# Patient Record
Sex: Female | Born: 1985 | Race: White | Hispanic: No | Marital: Married | Smoking: Never smoker
Health system: Southern US, Community
[De-identification: ages and names within clinical notes are randomized; demographics above are authoritative.]

## PROBLEM LIST (undated history)

## (undated) DIAGNOSIS — F32A Depression, unspecified: Secondary | ICD-10-CM

## (undated) DIAGNOSIS — J020 Streptococcal pharyngitis: Secondary | ICD-10-CM

## (undated) DIAGNOSIS — D696 Thrombocytopenia, unspecified: Secondary | ICD-10-CM

## (undated) DIAGNOSIS — F419 Anxiety disorder, unspecified: Secondary | ICD-10-CM

## (undated) DIAGNOSIS — D649 Anemia, unspecified: Secondary | ICD-10-CM

## (undated) DIAGNOSIS — F329 Major depressive disorder, single episode, unspecified: Secondary | ICD-10-CM

## (undated) HISTORY — PX: ADENOIDECTOMY: SUR15

## (undated) HISTORY — PX: TONSILLECTOMY: SUR1361

---
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
## 2004-03-08 ENCOUNTER — Other Ambulatory Visit: Admission: RE | Admit: 2004-03-08 | Discharge: 2004-03-08 | Payer: Self-pay | Admitting: Obstetrics and Gynecology

## 2004-09-08 ENCOUNTER — Other Ambulatory Visit: Admission: RE | Admit: 2004-09-08 | Discharge: 2004-09-08 | Payer: Self-pay | Admitting: Gynecology

## 2004-12-18 ENCOUNTER — Emergency Department (HOSPITAL_COMMUNITY): Admission: EM | Admit: 2004-12-18 | Discharge: 2004-12-18 | Payer: Self-pay | Admitting: Emergency Medicine

## 2007-11-24 ENCOUNTER — Encounter: Payer: Self-pay | Admitting: Gastroenterology

## 2007-12-10 ENCOUNTER — Other Ambulatory Visit: Admission: RE | Admit: 2007-12-10 | Discharge: 2007-12-10 | Payer: Self-pay | Admitting: Gynecology

## 2008-03-10 ENCOUNTER — Ambulatory Visit: Payer: Self-pay | Admitting: Gastroenterology

## 2008-03-10 DIAGNOSIS — F411 Generalized anxiety disorder: Secondary | ICD-10-CM | POA: Insufficient documentation

## 2008-03-10 DIAGNOSIS — R1084 Generalized abdominal pain: Secondary | ICD-10-CM

## 2008-03-10 DIAGNOSIS — K5900 Constipation, unspecified: Secondary | ICD-10-CM | POA: Insufficient documentation

## 2008-03-10 LAB — CONVERTED CEMR LAB
Tissue Transglutaminase Ab, IgA: 0.1 units (ref <7)
hCG, Beta Chain, Quant, S: <0.5 milliintl units/mL

## 2008-03-11 ENCOUNTER — Telehealth: Payer: Self-pay | Admitting: Gastroenterology

## 2008-03-11 DIAGNOSIS — E538 Deficiency of other specified B group vitamins: Secondary | ICD-10-CM | POA: Insufficient documentation

## 2008-03-12 ENCOUNTER — Ambulatory Visit: Payer: Self-pay | Admitting: Gastroenterology

## 2008-03-18 ENCOUNTER — Ambulatory Visit: Payer: Self-pay | Admitting: Gastroenterology

## 2008-03-25 ENCOUNTER — Ambulatory Visit: Payer: Self-pay | Admitting: Gastroenterology

## 2008-06-01 ENCOUNTER — Ambulatory Visit: Payer: Self-pay | Admitting: Gastroenterology

## 2008-06-10 ENCOUNTER — Other Ambulatory Visit: Admission: RE | Admit: 2008-06-10 | Discharge: 2008-06-10 | Payer: Self-pay | Admitting: Gynecology

## 2008-06-15 ENCOUNTER — Ambulatory Visit: Payer: Self-pay | Admitting: Gastroenterology

## 2008-06-15 ENCOUNTER — Encounter: Payer: Self-pay | Admitting: Gastroenterology

## 2008-06-16 LAB — CONVERTED CEMR LAB
ALT: 9 units/L (ref 0–35)
AST: 13 units/L (ref 0–37)
Albumin: 4 g/dL (ref 3.5–5.2)
Alkaline Phosphatase: 36 units/L — ABNORMAL LOW (ref 39–117)
BUN: 6 mg/dL (ref 6–23)
Basophils Absolute: 0 10*3/uL (ref 0.0–0.1)
Basophils Relative: 0.6 % (ref 0.0–1.0)
Bilirubin, Direct: 0.2 mg/dL (ref 0.0–0.3)
CO2: 32 meq/L (ref 19–32)
Calcium: 9.1 mg/dL (ref 8.4–10.5)
Chloride: 104 meq/L (ref 96–112)
Creatinine, Ser: 0.7 mg/dL (ref 0.4–1.2)
Eosinophils Absolute: 0.2 10*3/uL (ref 0.0–0.7)
Eosinophils Relative: 2.8 % (ref 0.0–5.0)
Ferritin: 14.8 ng/mL (ref 10.0–291.0)
Folate: 6.5 ng/mL
GFR calc Af Amer: 132 mL/min
GFR calc non Af Amer: 109 mL/min
Glucose, Bld: 69 mg/dL — ABNORMAL LOW (ref 70–99)
HCT: 40.5 % (ref 36.0–46.0)
Hemoglobin: 14 g/dL (ref 12.0–15.0)
Iron: 136 ug/dL (ref 42–145)
Lymphocytes Relative: 30.9 % (ref 12.0–46.0)
MCHC: 34.6 g/dL (ref 30.0–36.0)
MCV: 92.3 fL (ref 78.0–100.0)
Monocytes Absolute: 0.4 10*3/uL (ref 0.1–1.0)
Monocytes Relative: 6.9 % (ref 3.0–12.0)
Neutro Abs: 3.6 10*3/uL (ref 1.4–7.7)
Neutrophils Relative %: 58.8 % (ref 43.0–77.0)
Platelets: 165 10*3/uL (ref 150–400)
Potassium: 4.1 meq/L (ref 3.5–5.1)
RBC: 4.39 M/uL (ref 3.87–5.11)
RDW: 12.6 % (ref 11.5–14.6)
Saturation Ratios: 35.5 % (ref 20.0–50.0)
Sodium: 141 meq/L (ref 135–145)
TSH: 0.55 microintl units/mL (ref 0.35–5.50)
Total Bilirubin: 1.3 mg/dL — ABNORMAL HIGH (ref 0.3–1.2)
Total Protein: 6.6 g/dL (ref 6.0–8.3)
Transferrin: 273.3 mg/dL (ref >=212.0)
Vitamin B-12: 182 pg/mL — ABNORMAL LOW (ref 211–911)
WBC: 6.1 10*3/uL (ref 4.5–10.5)

## 2008-06-17 ENCOUNTER — Encounter: Payer: Self-pay | Admitting: Gastroenterology

## 2008-06-20 ENCOUNTER — Ambulatory Visit (HOSPITAL_COMMUNITY): Admission: RE | Admit: 2008-06-20 | Discharge: 2008-06-20 | Payer: Self-pay | Admitting: Gastroenterology

## 2008-06-22 ENCOUNTER — Telehealth: Payer: Self-pay | Admitting: Gastroenterology

## 2008-08-21 ENCOUNTER — Telehealth: Payer: Self-pay | Admitting: Gastroenterology

## 2008-10-05 ENCOUNTER — Telehealth: Payer: Self-pay | Admitting: Gastroenterology

## 2008-10-05 ENCOUNTER — Encounter: Payer: Self-pay | Admitting: Gastroenterology

## 2008-10-06 ENCOUNTER — Ambulatory Visit: Payer: Self-pay | Admitting: Gastroenterology

## 2008-10-06 DIAGNOSIS — K625 Hemorrhage of anus and rectum: Secondary | ICD-10-CM | POA: Insufficient documentation

## 2008-10-06 LAB — CONVERTED CEMR LAB
ALT: 10 units/L (ref 0–35)
AST: 17 units/L (ref 0–37)
Albumin: 3.9 g/dL (ref 3.5–5.2)
Alkaline Phosphatase: 27 units/L — ABNORMAL LOW (ref 39–117)
BUN: 9 mg/dL (ref 6–23)
Basophils Absolute: 0 10*3/uL (ref 0.0–0.1)
Basophils Relative: 0.8 % (ref 0.0–3.0)
Bilirubin, Direct: 0.1 mg/dL (ref 0.0–0.3)
CO2: 30 meq/L (ref 19–32)
Calcium: 9.1 mg/dL (ref 8.4–10.5)
Chloride: 103 meq/L (ref 96–112)
Creatinine, Ser: 0.8 mg/dL (ref 0.4–1.2)
Eosinophils Absolute: 0.1 10*3/uL (ref 0.0–0.7)
Eosinophils Relative: 2.5 % (ref 0.0–5.0)
GFR calc Af Amer: 113 mL/min
GFR calc non Af Amer: 94 mL/min
Glucose, Bld: 88 mg/dL (ref 70–99)
HCT: 35.7 % — ABNORMAL LOW (ref 36.0–46.0)
Hemoglobin: 12.4 g/dL (ref 12.0–15.0)
Lymphocytes Relative: 24.8 % (ref 12.0–46.0)
MCHC: 34.7 g/dL (ref 30.0–36.0)
MCV: 92.1 fL (ref 78.0–100.0)
Monocytes Absolute: 0.4 10*3/uL (ref 0.1–1.0)
Monocytes Relative: 7.2 % (ref 3.0–12.0)
Neutro Abs: 3.5 10*3/uL (ref 1.4–7.7)
Neutrophils Relative %: 64.7 % (ref 43.0–77.0)
Platelets: 128 10*3/uL — ABNORMAL LOW (ref 150–400)
Potassium: 4 meq/L (ref 3.5–5.1)
RBC: 3.88 M/uL (ref 3.87–5.11)
RDW: 12.1 % (ref 11.5–14.6)
Sed Rate: 9 mm/hr (ref 0–22)
Sodium: 138 meq/L (ref 135–145)
TSH: 1.31 microintl units/mL (ref 0.35–5.50)
Total Bilirubin: 1 mg/dL (ref 0.3–1.2)
Total Protein: 6.4 g/dL (ref 6.0–8.3)
WBC: 5.3 10*3/uL (ref 4.5–10.5)

## 2008-10-07 ENCOUNTER — Encounter: Payer: Self-pay | Admitting: Gastroenterology

## 2008-10-07 ENCOUNTER — Ambulatory Visit: Payer: Self-pay | Admitting: Gastroenterology

## 2008-10-12 ENCOUNTER — Encounter: Payer: Self-pay | Admitting: Gastroenterology

## 2008-10-20 ENCOUNTER — Telehealth: Payer: Self-pay | Admitting: Gastroenterology

## 2009-01-25 IMAGING — CR DG ABDOMEN 1V
2 series · 2 of 2 positions shown · non-contrast
Comparison: None.

CLINICAL DATA: 22-year-old female with history of constipation.
Sitz markers administered on 06/15/2008.

ABDOMEN - 1 VIEW

[t abdomen supine * (1 of 2)]
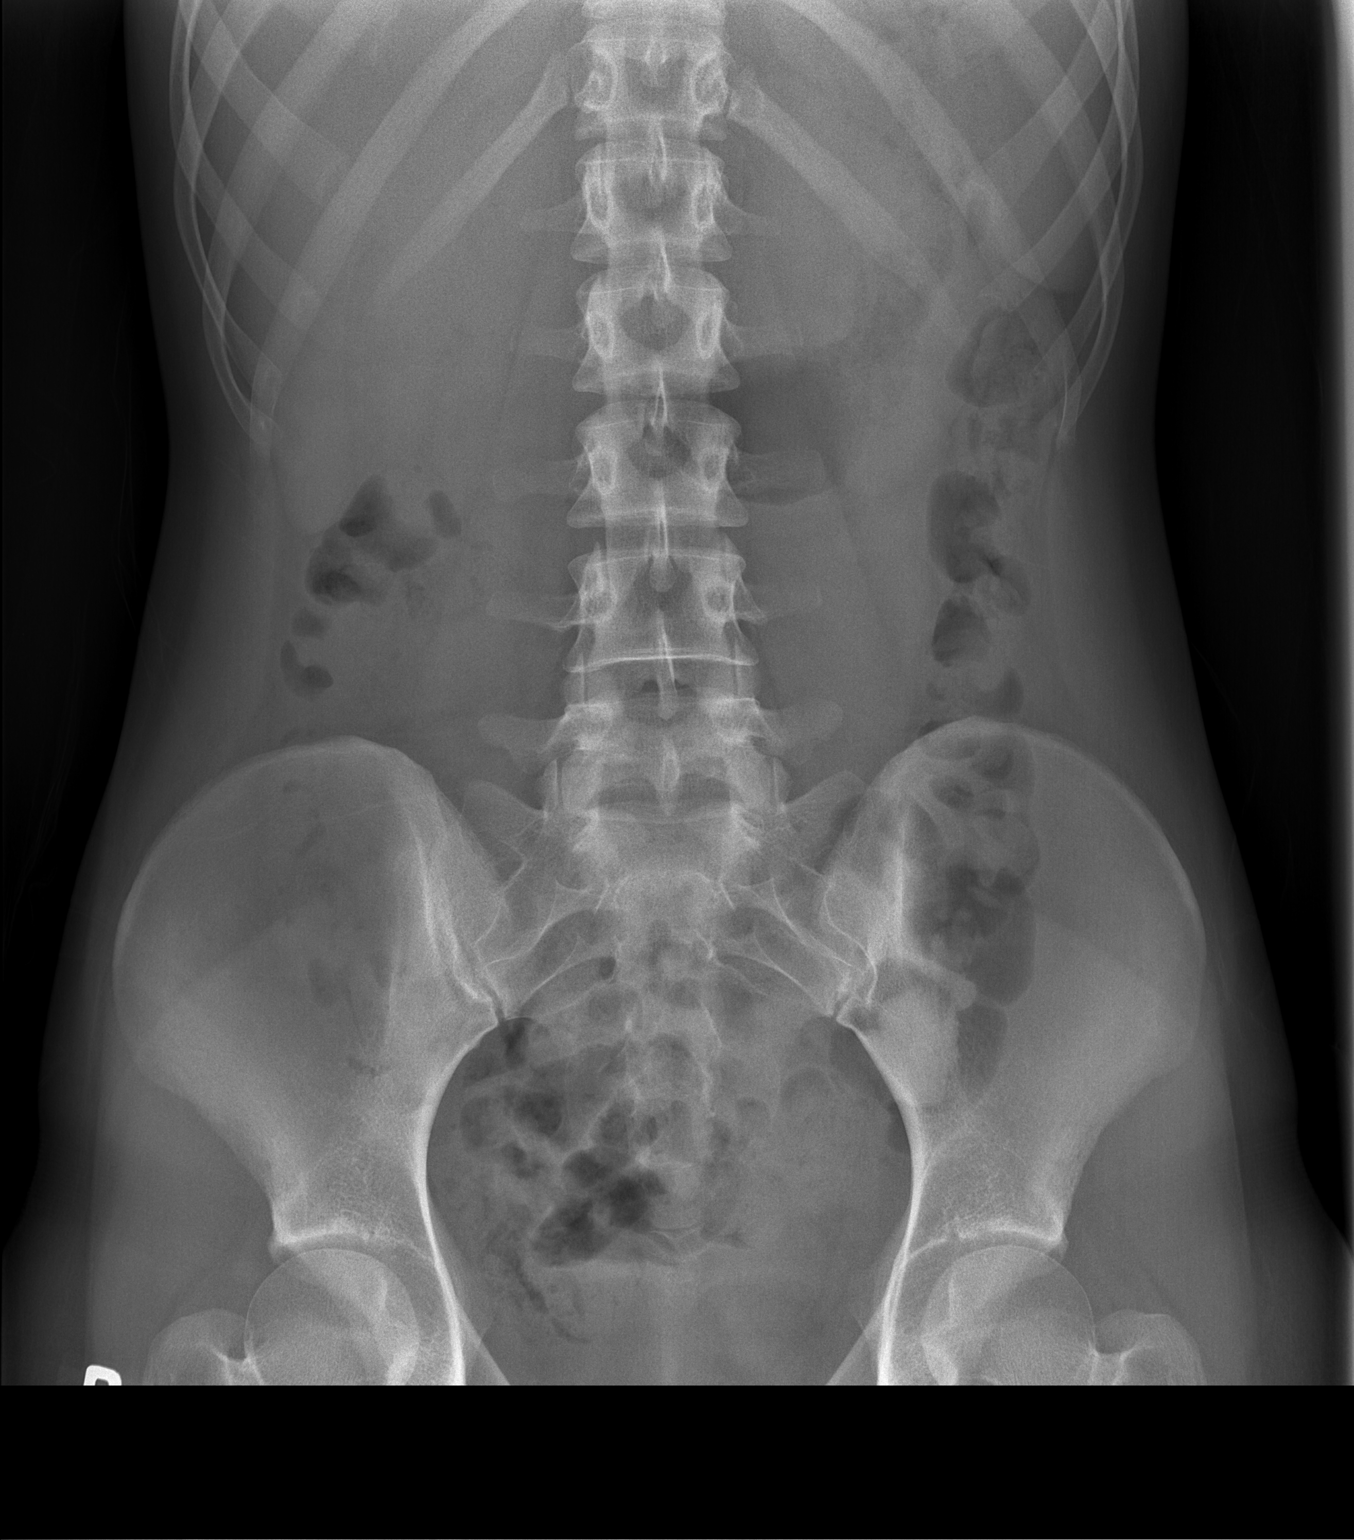

[t abdomen supine * (2 of 2)]
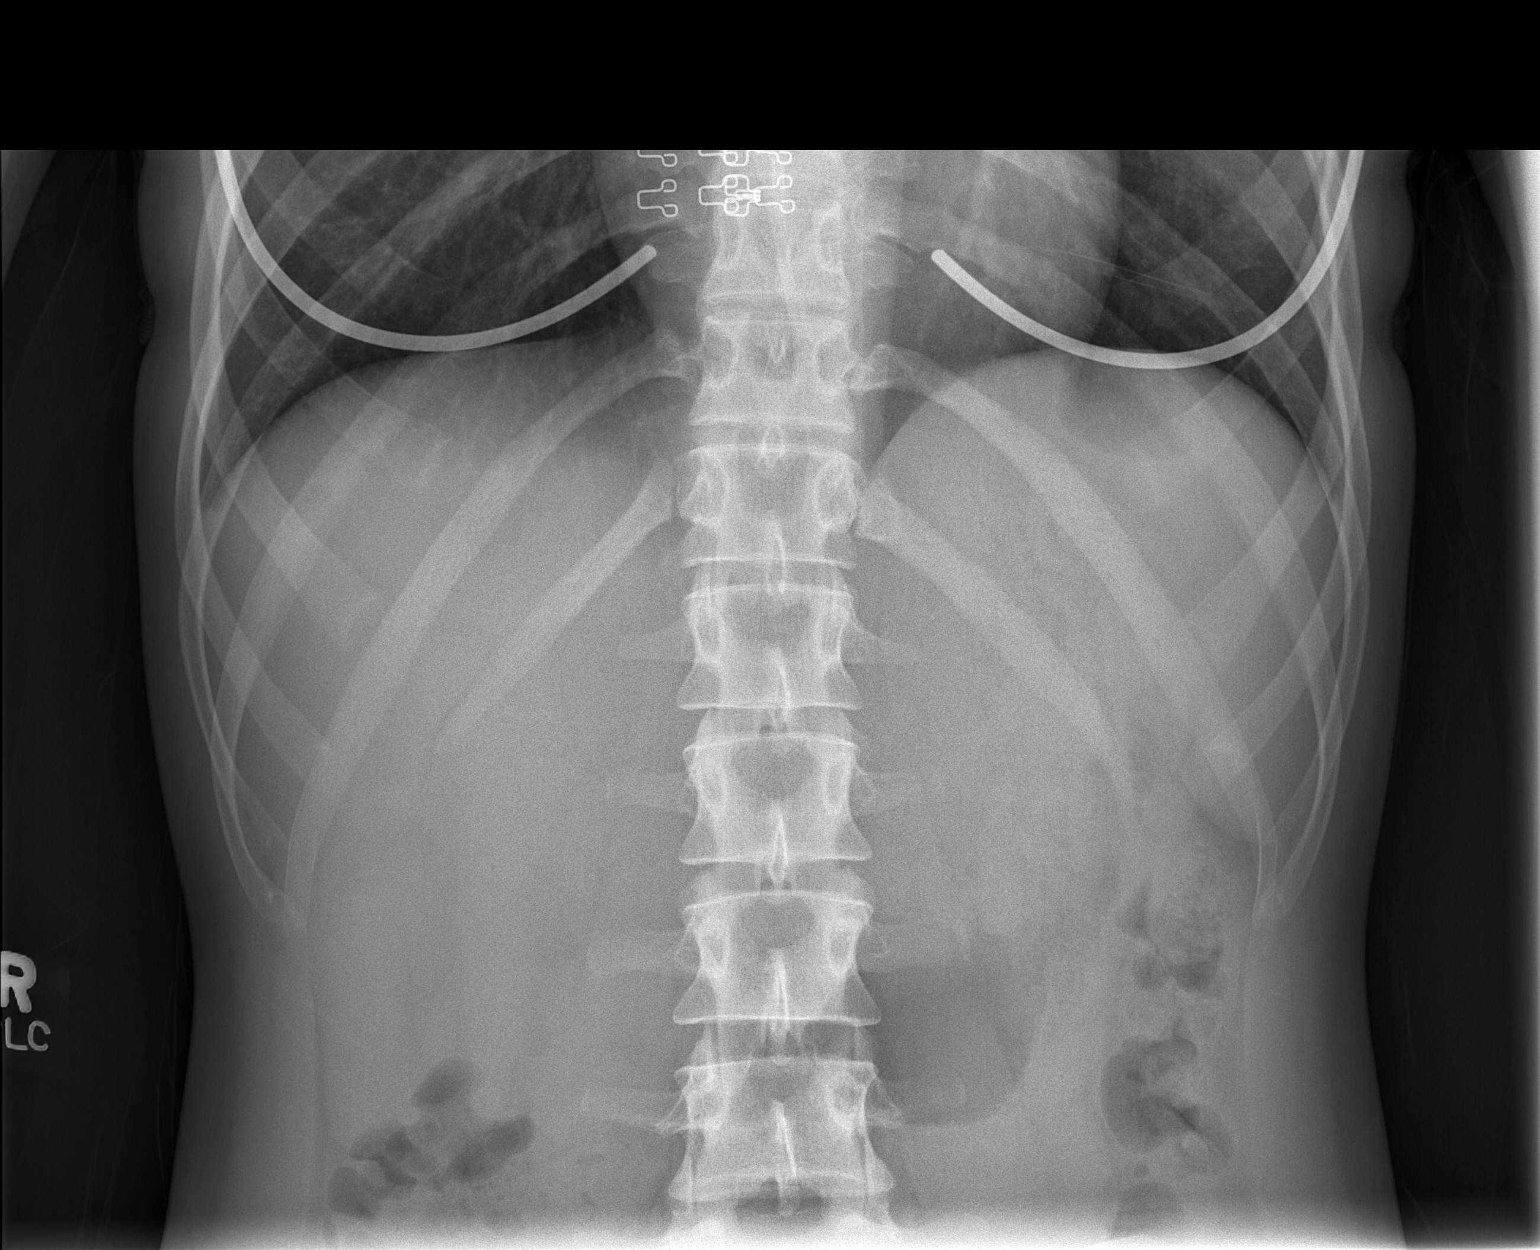

[2 of 2 positions shown; findings below may reference images not displayed]

FINDINGS: Nonobstructed bowel gas pattern. Lung bases are clear.
Normal visceral contours. No acute osseous abnormality identified.
No residual sitz markers identified.
IMPRESSION: No retained sitz markers identified. Nonobstructed bowel gas
pattern. Clear lung bases.

## 2009-06-17 ENCOUNTER — Encounter: Payer: Self-pay | Admitting: Gynecology

## 2009-06-17 ENCOUNTER — Other Ambulatory Visit: Admission: RE | Admit: 2009-06-17 | Discharge: 2009-06-17 | Payer: Self-pay | Admitting: Gynecology

## 2009-06-17 ENCOUNTER — Ambulatory Visit: Payer: Self-pay | Admitting: Gynecology

## 2009-08-18 ENCOUNTER — Inpatient Hospital Stay (HOSPITAL_COMMUNITY): Admission: AD | Admit: 2009-08-18 | Discharge: 2009-08-18 | Payer: Self-pay | Admitting: Gynecology

## 2010-04-02 ENCOUNTER — Emergency Department (HOSPITAL_COMMUNITY): Admission: EM | Admit: 2010-04-02 | Discharge: 2010-04-02 | Payer: Self-pay | Admitting: Emergency Medicine

## 2011-02-02 LAB — URINALYSIS, ROUTINE W REFLEX MICROSCOPIC
Bilirubin Urine: NEGATIVE
Hgb urine dipstick: NEGATIVE
Specific Gravity, Urine: 1.015 (ref 1.005–1.030)
Urobilinogen, UA: 0.2 mg/dL (ref 0.0–1.0)
pH: 6.5 (ref 5.0–8.0)

## 2011-02-02 LAB — POCT PREGNANCY, URINE: Preg Test, Ur: NEGATIVE

## 2011-09-01 ENCOUNTER — Other Ambulatory Visit: Payer: Self-pay | Admitting: Obstetrics and Gynecology

## 2011-09-01 ENCOUNTER — Other Ambulatory Visit (HOSPITAL_COMMUNITY)
Admission: RE | Admit: 2011-09-01 | Discharge: 2011-09-01 | Disposition: A | Discharge status: Home / Self Care | Payer: PRIVATE HEALTH INSURANCE | Source: Ambulatory Visit | Attending: Obstetrics and Gynecology | Admitting: Obstetrics and Gynecology

## 2011-09-01 DIAGNOSIS — Z113 Encounter for screening for infections with a predominantly sexual mode of transmission: Secondary | ICD-10-CM | POA: Insufficient documentation

## 2011-09-01 DIAGNOSIS — Z01419 Encounter for gynecological examination (general) (routine) without abnormal findings: Secondary | ICD-10-CM | POA: Insufficient documentation

## 2011-12-03 ENCOUNTER — Encounter (HOSPITAL_COMMUNITY): Payer: Self-pay

## 2011-12-03 ENCOUNTER — Emergency Department (HOSPITAL_COMMUNITY): Payer: PRIVATE HEALTH INSURANCE

## 2011-12-03 ENCOUNTER — Emergency Department (HOSPITAL_COMMUNITY)
Admission: EM | Admit: 2011-12-03 | Discharge: 2011-12-03 | Disposition: A | Discharge status: Home / Self Care | Payer: PRIVATE HEALTH INSURANCE | Attending: Emergency Medicine | Admitting: Emergency Medicine

## 2011-12-03 DIAGNOSIS — W268XXA Contact with other sharp object(s), not elsewhere classified, initial encounter: Secondary | ICD-10-CM | POA: Insufficient documentation

## 2011-12-03 DIAGNOSIS — S61411A Laceration without foreign body of right hand, initial encounter: Secondary | ICD-10-CM

## 2011-12-03 DIAGNOSIS — S61409A Unspecified open wound of unspecified hand, initial encounter: Secondary | ICD-10-CM | POA: Insufficient documentation

## 2011-12-03 HISTORY — DX: Anemia, unspecified: D64.9

## 2011-12-03 NOTE — ED Notes (Signed)
The pt has a laceration to the palmer surface of the rt hand at the base of her rt ring finger.  She did on a piece of glass just pta here.  No active bleeding at present.  Laceration cleaned with soap and water with a dsd bandage to the finger.  The finger has a flap-type lac

## 2011-12-03 NOTE — ED Provider Notes (Signed)
History     CSN: [id number]  Arrival date & time 12/03/11  0114   First MD Initiated Contact with Patient 12/03/11 0125      Chief Complaint  Patient presents with  . Laceration    cut palm under 4th digit on glass    (Consider location/radiation/quality/duration/timing/severity/associated sxs/prior treatment) HPI Patient presents the emergency department following a laceration from broken glass.  She states that she does not have any numbness or weakness of her fingers.  The laceration is just below the fourth digit on the right hand.  She states she does not know if there is any glass left in the wound.  Patient states her tetanus shot is up-to-date. Past Medical History  Diagnosis Date  . Anemia     Past Surgical History  Procedure Date  . Tonsillectomy   . Adenoidectomy     No family history on file.  History  Substance Use Topics  . Smoking status: Current Everyday Smoker  . Smokeless tobacco: Not on file  . Alcohol Use: Yes    OB History    Grav Para Term Preterm Abortions TAB SAB Ect Mult Living                  Review of Systems All pertinent positives and negatives reviewed in the history of present illness  Allergies  Amoxicillin; Ampicillin; Erythromycin; and Penicillins  Home Medications  No current outpatient prescriptions on file.  BP 120/76  Pulse 86  Temp(Src) 98.1 F (36.7 C) (Oral)  Resp 20  SpO2 99%  LMP 11/13/2011  Physical Exam  Constitutional: She appears well-developed and well-nourished. No distress.  Cardiovascular: Normal rate, regular rhythm and normal heart sounds.   Pulmonary/Chest: Effort normal and breath sounds normal.  Musculoskeletal:       Patient has a flap-type laceration at the base of her fourth digit on the palmar surface of the right hand.     ED Course  Procedures (including critical care time)  Labs Reviewed - No data to display Dg Hand Complete Right  12/03/2011  *RADIOLOGY REPORT*  Clinical Data:  Laceration.  RIGHT HAND - COMPLETE 3+ VIEW  Comparison: None.  Findings: The bones are normal. There is a vague density at the palmar aspect of the base of the ring finger which may represent a bandage.  It could represent a small glass fragment, however.  IMPRESSION: No osseous abnormality.  Small density in the soft tissues at the base of the fourth finger as described.  Original Report Authenticated By: Gwynn Burly, M.D.     LACERATION REPAIR Performed by: Carlyle Dolly Authorized by: Carlyle Dolly Consent: Verbal consent obtained. Risks and benefits: risks, benefits and alternatives were discussed Consent given by: patient Patient identity confirmed: provided demographic data Prepped and Draped in normal sterile fashion Wound explored  Laceration Location: Palmer aspect at the based on her 4th digit on the R hand  Laceration Length: 2 cm  4 small foreign bodies were removed from the wound and it was rinsed well afterwards. Anesthesia: local infiltration  Local anesthetic: lidocaine2% wo epinephrine  Anesthetic total: 8 ml  Irrigation method: syringe Amount of cleaning: standard  Skin closure: Prolene 4-0  Number of sutures:5  Technique: simple interrupted  Patient tolerance: Patient tolerated the procedure well with no immediate complications.  The patient is advised there is a flap that standing and it may not survive.  She voices an understanding of this.  On examination she has full range of  motion strength of the digit with normal sensation.  I did give her a followup with hand surgery as needed.  The x-rays were reviewed by me and the radiologist.  MDM          Carlyle Dolly, PA-C 12/03/11 0230

## 2011-12-03 NOTE — ED Provider Notes (Signed)
Medical screening examination/treatment/procedure(s) were conducted as a shared visit with non-physician practitioner(s) and myself.  I personally evaluated the patient during the encounter  Flint Melter, MD 12/03/11 [phone]

## 2011-12-25 ENCOUNTER — Encounter (HOSPITAL_COMMUNITY): Payer: Self-pay | Admitting: *Deleted

## 2011-12-25 ENCOUNTER — Emergency Department (INDEPENDENT_AMBULATORY_CARE_PROVIDER_SITE_OTHER)
Admission: EM | Admit: 2011-12-25 | Discharge: 2011-12-25 | Disposition: A | Discharge status: Home / Self Care | Payer: PRIVATE HEALTH INSURANCE | Source: Home / Self Care | Attending: Emergency Medicine | Admitting: Emergency Medicine

## 2011-12-25 DIAGNOSIS — B9789 Other viral agents as the cause of diseases classified elsewhere: Secondary | ICD-10-CM

## 2011-12-25 DIAGNOSIS — J029 Acute pharyngitis, unspecified: Secondary | ICD-10-CM

## 2011-12-25 DIAGNOSIS — B349 Viral infection, unspecified: Secondary | ICD-10-CM

## 2011-12-25 HISTORY — DX: Streptococcal pharyngitis: J02.0

## 2011-12-25 MED ORDER — IBUPROFEN 400 MG PO TABS: 400.0000 mg | ORAL_TABLET | Freq: Four times a day (QID) | ORAL | Status: AC | PRN

## 2011-12-25 MED ORDER — HYDROCODONE-ACETAMINOPHEN 7.5-500 MG/15ML PO SOLN: 5.0000 mL | Freq: Four times a day (QID) | ORAL | Status: AC | PRN

## 2011-12-25 MED ORDER — LIDOCAINE VISCOUS 2 % MT SOLN: 10.0000 mL | Freq: Three times a day (TID) | OROMUCOSAL | Status: AC | PRN

## 2011-12-25 NOTE — ED Provider Notes (Signed)
History     CSN: [id number]  Arrival date & time 12/25/11  0904   First MD Initiated Contact with Patient 12/25/11 1115      Chief Complaint  Patient presents with  . Cough    (Consider location/radiation/quality/duration/timing/severity/associated sxs/prior treatment) Patient is a 26 y.o. female presenting with pharyngitis. The history is provided by the patient. No language interpreter was used.  Sore Throat This is a new problem. The current episode started yesterday. The problem occurs constantly. Associated symptoms include chest pain and shortness of breath. The symptoms are aggravated by nothing. The symptoms are relieved by nothing. She has tried nothing for the symptoms.  Pt complains of a sorethroat.  Pt tried to go to work but felt to bad.  Past Medical History  Diagnosis Date  . Anemia   . Strep throat   . Ulcerative colitis     Past Surgical History  Procedure Date  . Tonsillectomy   . Adenoidectomy     Family History  Problem Relation Age of Onset  . Cancer Other   . Hypertension Other   . Thyroid disease Other   . Heart failure Other     History  Substance Use Topics  . Smoking status: Current Everyday Smoker  . Smokeless tobacco: Not on file  . Alcohol Use: Yes    OB History    Grav Para Term Preterm Abortions TAB SAB Ect Mult Living                  Review of Systems  Respiratory: Positive for shortness of breath.   Cardiovascular: Positive for chest pain.  All other systems reviewed and are negative.    Allergies  Amoxicillin; Ampicillin; Erythromycin; and Penicillins  Home Medications   Current Outpatient Rx  Name Route Sig Dispense Refill  . HYDROCODONE-ACETAMINOPHEN 7.5-500 MG/15ML PO SOLN Oral Take 5 mLs by mouth every 6 (six) hours as needed for pain. 120 mL 0  . IBUPROFEN 400 MG PO TABS Oral Take 1 tablet (400 mg total) by mouth every 6 (six) hours as needed for pain or fever. 30 tablet 0  . LIDOCAINE VISCOUS 2 % MT SOLN  Oral Take 10 mLs by mouth 3 (three) times daily as needed for pain. Swish and spit. Do not swallow. 100 mL 0    BP 118/63  Pulse 80  Temp(Src) 98.5 F (36.9 C) (Oral)  Resp 16  SpO2 100%  LMP 12/21/2011  Physical Exam  Vitals reviewed. Constitutional: She appears well-developed and well-nourished.  HENT:  Head: Normocephalic.  Right Ear: External ear normal.  Left Ear: External ear normal.       Throat swollen,  erythematous  Eyes: Conjunctivae are normal. Pupils are equal, round, and reactive to light.  Neck: Normal range of motion.  Cardiovascular: Normal rate.   Pulmonary/Chest: Effort normal.  Abdominal: Soft.  Musculoskeletal: Normal range of motion.  Neurological: She is alert.  Skin: Skin is warm.    ED Course  Procedures (including critical care time)   Labs Reviewed  POCT RAPID STREP A ([facility]RG CARE ONLY)   No results found.   1. Pharyngitis with viral syndrome       MDM  Pt given rx for ibuprofe, hydrocodone, and lidocaine       Langston Masker, [place] 12/25/11 1227

## 2011-12-25 NOTE — ED Notes (Signed)
Pt reports  Symptoms  od f  Sinus  Congestion  Non  Productive  Cough     Body  aches  And  Pressure  Behind  Eyes    -  Symptoms    Started  Last pm   - the pt  Is  Awake  As  Well  As  Alert  And  Oriented  The  Pt  Is  Speaking in  Complete  sentances

## 2011-12-25 NOTE — Discharge Instructions (Signed)
Take the medication as written. Take 1 gram of tylenol with the motrin up to 4 times a day as needed for pain and fever. This is an effective combination. Drink extra fluids. Use a neti pot or the NeilMed sinus rinse as often as you want to to reduce nasal congestion. Follow the directions on the box. Return if you get worse, have a fever >100.4, or for any concerns.

## 2011-12-29 NOTE — ED Provider Notes (Signed)
Medical screening examination/treatment/procedure(s) were performed by non-physician practitioner and as supervising physician I was immediately available for consultation/collaboration.  Luiz Blare MD   Luiz Blare, MD 12/29/11 [phone]

## 2012-11-17 ENCOUNTER — Ambulatory Visit (INDEPENDENT_AMBULATORY_CARE_PROVIDER_SITE_OTHER): Payer: BC Managed Care – PPO | Admitting: Physician Assistant

## 2012-11-17 VITALS — BP 114/72 | HR 98 | Temp 100.5°F | Resp 16 | Ht 63.0 in | Wt 108.0 lb

## 2012-11-17 DIAGNOSIS — J101 Influenza due to other identified influenza virus with other respiratory manifestations: Secondary | ICD-10-CM

## 2012-11-17 DIAGNOSIS — R05 Cough: Secondary | ICD-10-CM

## 2012-11-17 DIAGNOSIS — J111 Influenza due to unidentified influenza virus with other respiratory manifestations: Secondary | ICD-10-CM

## 2012-11-17 DIAGNOSIS — M791 Myalgia, unspecified site: Secondary | ICD-10-CM

## 2012-11-17 DIAGNOSIS — R509 Fever, unspecified: Secondary | ICD-10-CM

## 2012-11-17 DIAGNOSIS — IMO0001 Reserved for inherently not codable concepts without codable children: Secondary | ICD-10-CM

## 2012-11-17 MED ORDER — OSELTAMIVIR PHOSPHATE 75 MG PO CAPS: 75.0000 mg | ORAL_CAPSULE | Freq: Two times a day (BID) | ORAL | Status: DC

## 2012-11-17 MED ORDER — ONDANSETRON 4 MG PO TBDP: 4.0000 mg | ORAL_TABLET | Freq: Three times a day (TID) | ORAL | Status: DC | PRN

## 2012-11-17 MED ORDER — HYDROCODONE-HOMATROPINE 5-1.5 MG/5ML PO SYRP: ORAL_SOLUTION | ORAL | Status: DC

## 2012-11-17 NOTE — Patient Instructions (Addendum)
Influenza A (H1N1)  Take Tamiflu twice daily. You may take the Zofran as needed for nausea. You may take ibuprofen 600 mg every 6 hours and Tylenol 1000 mg every 6 hours, just be sure to off set them by 2 hours. You can take Hycodan for cough every 4 hours. Push fluids.  H1N1 formerly called "swine flu" is a new influenza virus causing sickness in people. The H1N1 virus is different from seasonal influenza viruses. However, the H1N1 symptoms are similar to seasonal influenza and it is spread from person to person. You may be at higher risk for serious problems if you have underlying serious medical conditions. The CDC and the [employer] are following reported cases around the world. CAUSES   The flu is thought to spread mainly person-to-person through coughing or sneezing of infected people.  A person may become infected by touching something with the virus on it and then touching their mouth or nose. SYMPTOMS   Fever.  Headache.  Tiredness.  Cough.  Sore throat.  Runny or stuffy nose.  Body aches.  Diarrhea and vomiting These symptoms are referred to as "flu-like symptoms." A lot of different illnesses, including the common cold, may have similar symptoms. DIAGNOSIS   There are tests that can tell if you have the H1N1 virus.  Confirmed cases of H1N1 will be reported to the state or local health department.  A doctor's exam may be needed to tell whether you have an infection that is a complication of the flu. HOME CARE INSTRUCTIONS   Stay informed. Visit the [facility] website for current recommendations. Visit [employer]. You may also call 1-800-CDC-INFO (217 856 8530).  Get help early if you develop any of the above symptoms.  If you are at high risk from complications of the flu, talk to your caregiver as soon as you develop flu-like symptoms. Those at higher risk for complications include:  People 65 years or older.  People with chronic medical  conditions.  Pregnant women.  Young children.  Your caregiver may recommend antiviral medicine to help treat the flu.  If you get the flu, get plenty of rest, drink enough water and fluids to keep your urine clear or pale yellow, and avoid using alcohol or tobacco.  You may take over-the-counter medicine to relieve the symptoms of the flu if your caregiver approves. (Never give aspirin to children or teenagers who have flu-like symptoms, particularly fever). TREATMENT  If you do get sick, antiviral drugs are available. These drugs can make your illness milder and make you feel better faster. Treatment should start soon after illness starts. It is only effective if taken within the first day of becoming ill. Only your caregiver can prescribe antiviral medication.  PREVENTION   Cover your nose and mouth with a tissue or your arm when you cough or sneeze. Throw the tissue away.  Wash your hands often with soap and warm water, especially after you cough or sneeze. Alcohol-based cleaners are also effective against germs.  Avoid touching your eyes, nose or mouth. This is one way germs spread.  Try to avoid contact with sick people. Follow public health advice regarding school closures. Avoid crowds.  Stay home if you get sick. Limit contact with others to keep from infecting them. People infected with the H1N1 virus may be able to infect others anywhere from 1 day before feeling sick to 5-7 days after getting flu symptoms.  An H1N1 vaccine is available to help protect against the virus. In addition  to the H1N1 vaccine, you will need to be vaccinated for seasonal influenza. The H1N1 and seasonal vaccines may be given on the same day. The CDC especially recommends the H1N1 vaccine for:  Pregnant women.  People who live with or care for children younger than 21 months of age.  Health care and emergency services personnel.  Persons between the ages of 68 months through 29 years of age.  People  from ages 23 through 1 years who are at higher risk for H1N1 because of chronic health disorders or immune system problems. FACEMASKS In community and home settings, the use of facemasks and N95 respirators are not normally recommended. In certain circumstances, a facemask or N95 respirator may be used for persons at increased risk of severe illness from influenza. Your caregiver can give additional recommendations for facemask use. IN CHILDREN, EMERGENCY WARNING SIGNS THAT NEED URGENT MEDICAL CARE:  Fast breathing or trouble breathing.  Bluish skin color.  Not drinking enough fluids.  Not waking up or not interacting normally.  Being so fussy that the child does not want to be held.  Your child has an oral temperature above 102 F (38.9 C), not controlled by medicine.  Your baby is older than 3 months with a rectal temperature of 102 F (38.9 C) or higher.  Your baby is 64 months old or younger with a rectal temperature of 100.4 F (38 C) or higher.  Flu-like symptoms improve but then return with fever and worse cough. IN ADULTS, EMERGENCY WARNING SIGNS THAT NEED URGENT MEDICAL CARE:  Difficulty breathing or shortness of breath.  Pain or pressure in the chest or abdomen.  Sudden dizziness.  Confusion.  Severe or persistent vomiting.  Bluish color.  You have a oral temperature above 102 F (38.9 C), not controlled by medicine.  Flu-like symptoms improve but return with fever and worse cough. SEEK IMMEDIATE MEDICAL CARE IF:  You or someone you know is experiencing any of the above symptoms. When you arrive at the emergency center, report that you think you have the flu. You may be asked to wear a mask and/or sit in a secluded area to protect others from getting sick. MAKE SURE YOU:   Understand these instructions.  Will watch your condition.  Will get help right away if you are not doing well or get worse. Some of this information courtesy of the CDC.  Document  Released: 04/03/2008 Document Revised: 01/08/2012 Document Reviewed: 04/03/2008 [facility] Patient Information 2013 [place], [place].

## 2012-11-17 NOTE — Progress Notes (Signed)
Patient ID: Debbie Melendez MRN: [id number], DOB: Debbie Melendez 24, 1987, 27 y.o. Date of Encounter: 11/17/2012, 11:18 AM  Primary Physician: Cala Bradford, MD  Chief Complaint: Flu like symptoms for 1-2 days  HPI: 27 y.o. year old female with history below presents with a one to two day history of sudden onset of fever, chills, myalgias, congestion, otalgia, cough, and sore throat. T max 101 the previous evening. Cough is not productive, however she will get into coughing episodes. No shortness of breath, wheezing, or chest pain. Generalized headache. Worst symptoms are the myalgias and otalgia. Patient initially with URI symptoms two weeks ago causing her to miss four days from work that fully resolved. Last took an antipyretic around 7 AM this morning, consisting of Tylenol Cold and Flu. Decreased appetite, but she has been pushing fluids. Some nausea, not emesis or diarrhea. No known sick contacts. No influenza vaccine this year. Here with boyfriend to drive her. History taken from patient and boyfriend.   Past Medical History  Diagnosis Date  . Anemia   . Strep throat   . Ulcerative colitis      Home Meds: Prior to Admission medications   Not on File    Allergies:  Allergies  Allergen Reactions  . Amoxicillin     REACTION: Hives  . Ampicillin     REACTION: Hives  . Erythromycin   . Penicillins     REACTION: Hives    History   Social History  . Marital Status: Single    Spouse Name: N/A    Number of Children: N/A  . Years of Education: N/A   Occupational History  . Not on file.   Social History Main Topics  . Smoking status: Current Every Day Smoker  . Smokeless tobacco: Not on file  . Alcohol Use: Yes  . Drug Use: No  . Sexually Active: Yes    Birth Control/ Protection: None   Other Topics Concern  . Not on file   Social History Narrative  . No narrative on file     Review of Systems: Constitutional: Positive for fever, chills, myalgias, and fatigue. Negative for  weight changes.  HEENT: see above Cardiovascular: Negative for chest pain or palpitations Respiratory: Positive for cough. Negative for hemoptysis, wheezing, shortness of breath, tachypnea, or dyspnea Abdominal: Positive for nausea. Negative for abdominal pain, vomiting, or diarrhea Dermatological: Negative for rash. Neurologic: Positive for headache. Negative for dizziness, vertigo, or syncope   Physical Exam: Blood pressure 114/72, pulse 98, temperature 100.5 F (38.1 C), resp. rate 16, height 5\' 3"  (1.6 m), weight 108 lb (48.988 kg), last menstrual period 11/03/2012, SpO2 99.00%., Body mass index is 19.13 kg/(m^2). General: Well developed, well nourished, in no acute distress. Patient laying on exam table upon entering exam room.  Head: Normocephalic, atraumatic, eyes without discharge, sclera non-icteric, nares are congested. Bilateral auditory canals clear, TM's are without perforation, pearly grey and translucent with reflective cone of light bilaterally. Oral cavity moist, posterior pharynx without exudate, erythema, peritonsillar abscess, or post nasal drip. Uvula midline. Neck: Supple. No thyromegaly. Full ROM. No lymphadenopathy. Lungs: Clear bilaterally to auscultation without wheezes, rales, or rhonchi. Breathing is unlabored. Heart: RRR with S1 S2. No murmurs, rubs, or gallops appreciated. Abdomen: Soft, non-tender, non-distended with normoactive bowel sounds. No hepatosplenomegaly. No rebound/guarding. No obvious abdominal masses. Negative McBurney's, Rovsing's, Iliopsoas, and table jar.  Msk:  Strength and tone normal for age. Extremities/Skin: Warm and dry. No clubbing or cyanosis. No edema. No rashes or suspicious  lesions. Neuro: Alert and oriented X 3. Moves all extremities spontaneously. Gait is normal. CNII-XII grossly in tact. Psych:  Responds to questions appropriately with a normal affect.   Labs: Results for orders placed in visit on 11/17/12  POCT INFLUENZA A/B       Component Value Range   Influenza A, POC Positive     Influenza B, POC Negative       ASSESSMENT AND PLAN:  27 y.o. year old female with influenza A, fever, myalgias, and cough. -Tamiflu 75 mg 1 po bid #10 no RF -Hycodan #4oz 1 tsp po q 4-6 hours prn cough no RF, SED -Zofran ODT 4 mg 1 po under tongue q 8 hours prn nausea #20 no RF -Tylenol/Motrin -Push fluids -Advised if falls behind on hydration she may need IVF -Rest -Will need to be out of work until afebrile 24 hours without the aid of antipyretics -Out of work note written through 11/21/12, may need to be extended -Note written for boyfriend missing work today to take care of paitent -RTC precautions  Signed, Eula Listen, PA-C 11/17/2012 11:18 AM

## 2012-11-18 ENCOUNTER — Telehealth: Payer: Self-pay

## 2012-11-18 NOTE — Telephone Encounter (Signed)
PT WAS HERE LAST NIGHT AND SAW RYAN FOR FLU.  JEREMY IS HER BOYFRIEND AND WAS HERE WITH HER.  BY THE END OF THE NIGHT HE WAS RUNNING A FEVER.  WANTS TO KNOW IF WE CAN CALL HIM IN SOMETHING FOR IT.  I ADVISED HIM HE MAY HAVE TO BE SEEN.  [phone]

## 2012-11-18 NOTE — Telephone Encounter (Signed)
He is not our patient, I advised we can not do this for him but we would be happy to see him.

## 2013-03-06 ENCOUNTER — Other Ambulatory Visit: Payer: Self-pay | Admitting: Obstetrics and Gynecology

## 2013-03-06 ENCOUNTER — Other Ambulatory Visit (HOSPITAL_COMMUNITY)
Admission: RE | Admit: 2013-03-06 | Discharge: 2013-03-06 | Disposition: A | Discharge status: Home / Self Care | Payer: BC Managed Care – PPO | Source: Ambulatory Visit | Attending: Obstetrics and Gynecology | Admitting: Obstetrics and Gynecology

## 2013-03-06 DIAGNOSIS — Z113 Encounter for screening for infections with a predominantly sexual mode of transmission: Secondary | ICD-10-CM | POA: Insufficient documentation

## 2013-03-06 DIAGNOSIS — Z01419 Encounter for gynecological examination (general) (routine) without abnormal findings: Secondary | ICD-10-CM | POA: Insufficient documentation

## 2015-10-18 LAB — OB RESULTS CONSOLE GC/CHLAMYDIA
CHLAMYDIA, DNA PROBE: NEGATIVE
GC PROBE AMP, GENITAL: NEGATIVE

## 2015-10-27 LAB — OB RESULTS CONSOLE ABO/RH: RH TYPE: POSITIVE

## 2015-10-27 LAB — OB RESULTS CONSOLE RUBELLA ANTIBODY, IGM: Rubella: IMMUNE

## 2015-10-27 LAB — OB RESULTS CONSOLE HEPATITIS B SURFACE ANTIGEN: HEP B S AG: NEGATIVE

## 2015-10-27 LAB — OB RESULTS CONSOLE RPR: RPR: NONREACTIVE

## 2015-10-27 LAB — OB RESULTS CONSOLE ANTIBODY SCREEN: ANTIBODY SCREEN: NEGATIVE

## 2015-10-27 LAB — OB RESULTS CONSOLE HIV ANTIBODY (ROUTINE TESTING): HIV: NONREACTIVE

## 2016-05-04 LAB — OB RESULTS CONSOLE GBS: GBS: NEGATIVE

## 2016-05-26 ENCOUNTER — Inpatient Hospital Stay (HOSPITAL_COMMUNITY)
Admission: AD | Admit: 2016-05-26 | Discharge: 2016-05-29 | DRG: 765 | Disposition: A | Discharge status: Home / Self Care | Payer: No Typology Code available for payment source | Source: Ambulatory Visit | Attending: Obstetrics & Gynecology | Admitting: Obstetrics & Gynecology

## 2016-05-26 ENCOUNTER — Inpatient Hospital Stay (HOSPITAL_COMMUNITY): Payer: No Typology Code available for payment source | Admitting: Anesthesiology

## 2016-05-26 ENCOUNTER — Encounter (HOSPITAL_COMMUNITY)
Admission: AD | Disposition: A | Discharge status: Home / Self Care | Payer: Self-pay | Source: Ambulatory Visit | Attending: Obstetrics & Gynecology

## 2016-05-26 ENCOUNTER — Encounter (HOSPITAL_COMMUNITY): Payer: Self-pay | Admitting: *Deleted

## 2016-05-26 DIAGNOSIS — D6959 Other secondary thrombocytopenia: Secondary | ICD-10-CM | POA: Diagnosis present

## 2016-05-26 DIAGNOSIS — F419 Anxiety disorder, unspecified: Secondary | ICD-10-CM | POA: Diagnosis present

## 2016-05-26 DIAGNOSIS — O324XX0 Maternal care for high head at term, not applicable or unspecified: Principal | ICD-10-CM | POA: Diagnosis present

## 2016-05-26 DIAGNOSIS — O9962 Diseases of the digestive system complicating childbirth: Secondary | ICD-10-CM | POA: Diagnosis present

## 2016-05-26 DIAGNOSIS — D649 Anemia, unspecified: Secondary | ICD-10-CM | POA: Diagnosis not present

## 2016-05-26 DIAGNOSIS — K219 Gastro-esophageal reflux disease without esophagitis: Secondary | ICD-10-CM | POA: Diagnosis present

## 2016-05-26 DIAGNOSIS — O9832 Other infections with a predominantly sexual mode of transmission complicating childbirth: Secondary | ICD-10-CM | POA: Diagnosis present

## 2016-05-26 DIAGNOSIS — Z98891 History of uterine scar from previous surgery: Secondary | ICD-10-CM

## 2016-05-26 DIAGNOSIS — O9912 Other diseases of the blood and blood-forming organs and certain disorders involving the immune mechanism complicating childbirth: Secondary | ICD-10-CM | POA: Diagnosis present

## 2016-05-26 DIAGNOSIS — O99344 Other mental disorders complicating childbirth: Secondary | ICD-10-CM | POA: Diagnosis present

## 2016-05-26 DIAGNOSIS — A6000 Herpesviral infection of urogenital system, unspecified: Secondary | ICD-10-CM | POA: Diagnosis present

## 2016-05-26 DIAGNOSIS — Z3A38 38 weeks gestation of pregnancy: Secondary | ICD-10-CM

## 2016-05-26 DIAGNOSIS — O9081 Anemia of the puerperium: Secondary | ICD-10-CM | POA: Diagnosis not present

## 2016-05-26 DIAGNOSIS — Z3403 Encounter for supervision of normal first pregnancy, third trimester: Secondary | ICD-10-CM | POA: Diagnosis present

## 2016-05-26 LAB — CBC
HEMATOCRIT: 36 % (ref 36.0–46.0)
HEMOGLOBIN: 12.8 g/dL (ref 12.0–15.0)
MCH: 34.3 pg — AB (ref 26.0–34.0)
MCHC: 35.6 g/dL (ref 30.0–36.0)
MCV: 96.5 fL (ref 78.0–100.0)
PLATELETS: 96 10*3/uL — AB (ref 150–400)
RBC: 3.73 MIL/uL — AB (ref 3.87–5.11)
RDW: 14.3 % (ref 11.5–15.5)
WBC: 10.6 10*3/uL — AB (ref 4.0–10.5)

## 2016-05-26 LAB — RPR: RPR: NONREACTIVE

## 2016-05-26 LAB — ABO/RH: ABO/RH(D): A POS

## 2016-05-26 LAB — TYPE AND SCREEN
ABO/RH(D): A POS
Antibody Screen: NEGATIVE

## 2016-05-26 SURGERY — Surgical Case
Anesthesia: Epidural

## 2016-05-26 MED ORDER — CEFAZOLIN SODIUM-DEXTROSE 2-4 GM/100ML-% IV SOLN
INTRAVENOUS | Status: AC
  Filled 2016-05-26: qty 100

## 2016-05-26 MED ORDER — OXYCODONE HCL 5 MG PO TABS
5.0000 mg | ORAL_TABLET | Freq: Four times a day (QID) | ORAL | Status: DC | PRN
  Administered 2016-05-27: 5 mg via ORAL

## 2016-05-26 MED ORDER — MENTHOL 3 MG MT LOZG: 1.0000 | LOZENGE | OROMUCOSAL | Status: DC | PRN

## 2016-05-26 MED ORDER — OXYCODONE-ACETAMINOPHEN 5-325 MG PO TABS: 2.0000 | ORAL_TABLET | ORAL | Status: DC | PRN

## 2016-05-26 MED ORDER — FENTANYL 2.5 MCG/ML BUPIVACAINE 1/10 % EPIDURAL INFUSION (WH - ANES)
14.0000 mL/h | INTRAMUSCULAR | Status: DC | PRN
  Administered 2016-05-26 (×2): 14 mL/h via EPIDURAL
  Filled 2016-05-26 (×2): qty 125

## 2016-05-26 MED ORDER — IBUPROFEN 600 MG PO TABS: 600.0000 mg | ORAL_TABLET | Freq: Four times a day (QID) | ORAL | Status: DC

## 2016-05-26 MED ORDER — LIDOCAINE HCL (PF) 1 % IJ SOLN
30.0000 mL | INTRAMUSCULAR | Status: DC | PRN
  Filled 2016-05-26: qty 30

## 2016-05-26 MED ORDER — HYDROMORPHONE HCL 1 MG/ML IJ SOLN: 0.2500 mg | INTRAMUSCULAR | Status: DC | PRN

## 2016-05-26 MED ORDER — BUPIVACAINE HCL (PF) 0.25 % IJ SOLN
INTRAMUSCULAR | Status: AC
  Filled 2016-05-26: qty 30

## 2016-05-26 MED ORDER — NALBUPHINE HCL 10 MG/ML IJ SOLN
5.0000 mg | Freq: Once | INTRAMUSCULAR | Status: AC | PRN
  Administered 2016-05-26: 5 mg via INTRAVENOUS
  Filled 2016-05-26: qty 1

## 2016-05-26 MED ORDER — LACTATED RINGERS IV SOLN
INTRAVENOUS | Status: DC
  Administered 2016-05-27: via INTRAVENOUS

## 2016-05-26 MED ORDER — SCOPOLAMINE 1 MG/3DAYS TD PT72
MEDICATED_PATCH | TRANSDERMAL | Status: AC
Start: 2016-05-26 — End: 2016-05-26
  Filled 2016-05-26: qty 1

## 2016-05-26 MED ORDER — FLEET ENEMA 7-19 GM/118ML RE ENEM: 1.0000 | ENEMA | RECTAL | Status: DC | PRN

## 2016-05-26 MED ORDER — WITCH HAZEL-GLYCERIN EX PADS: 1.0000 "application " | MEDICATED_PAD | CUTANEOUS | Status: DC | PRN

## 2016-05-26 MED ORDER — OXYCODONE-ACETAMINOPHEN 5-325 MG PO TABS: 1.0000 | ORAL_TABLET | ORAL | Status: DC | PRN

## 2016-05-26 MED ORDER — METHYLERGONOVINE MALEATE 0.2 MG PO TABS: 0.2000 mg | ORAL_TABLET | ORAL | Status: DC | PRN

## 2016-05-26 MED ORDER — PANTOPRAZOLE SODIUM 40 MG PO TBEC
40.0000 mg | DELAYED_RELEASE_TABLET | Freq: Two times a day (BID) | ORAL | Status: DC
Start: 2016-05-27 — End: 2016-05-29
  Administered 2016-05-27 – 2016-05-29 (×5): 40 mg via ORAL
  Filled 2016-05-26 (×5): qty 1

## 2016-05-26 MED ORDER — SIMETHICONE 80 MG PO CHEW
80.0000 mg | CHEWABLE_TABLET | ORAL | Status: DC
  Administered 2016-05-27 – 2016-05-28 (×2): 80 mg via ORAL
  Filled 2016-05-26 (×2): qty 1

## 2016-05-26 MED ORDER — PHENYLEPHRINE 40 MCG/ML (10ML) SYRINGE FOR IV PUSH (FOR BLOOD PRESSURE SUPPORT)
80.0000 ug | PREFILLED_SYRINGE | INTRAVENOUS | Status: DC | PRN
Start: 2016-05-26 — End: 2016-05-26
  Filled 2016-05-26: qty 10

## 2016-05-26 MED ORDER — OXYTOCIN 40 UNITS IN LACTATED RINGERS INFUSION - SIMPLE MED: 2.5000 [IU]/h | INTRAVENOUS | Status: AC

## 2016-05-26 MED ORDER — NALOXONE HCL 0.4 MG/ML IJ SOLN: 0.4000 mg | INTRAMUSCULAR | Status: DC | PRN

## 2016-05-26 MED ORDER — EPHEDRINE 5 MG/ML INJ: 10.0000 mg | INTRAVENOUS | Status: DC | PRN

## 2016-05-26 MED ORDER — MEPERIDINE HCL 25 MG/ML IJ SOLN: 6.2500 mg | INTRAMUSCULAR | Status: DC | PRN

## 2016-05-26 MED ORDER — PRENATAL MULTIVITAMIN CH
1.0000 | ORAL_TABLET | Freq: Every day | ORAL | Status: DC
  Administered 2016-05-27 – 2016-05-29 (×2): 1 via ORAL
  Filled 2016-05-26 (×2): qty 1

## 2016-05-26 MED ORDER — FENTANYL CITRATE (PF) 100 MCG/2ML IJ SOLN
50.0000 ug | INTRAMUSCULAR | Status: DC | PRN
  Administered 2016-05-26: 50 ug via INTRAVENOUS
  Filled 2016-05-26: qty 2

## 2016-05-26 MED ORDER — SODIUM CHLORIDE 0.9% FLUSH: 3.0000 mL | INTRAVENOUS | Status: DC | PRN

## 2016-05-26 MED ORDER — MEPERIDINE HCL 25 MG/ML IJ SOLN
INTRAMUSCULAR | Status: DC | PRN
  Administered 2016-05-26 (×2): 12.5 mg via INTRAVENOUS

## 2016-05-26 MED ORDER — ACETAMINOPHEN 325 MG PO TABS: 650.0000 mg | ORAL_TABLET | ORAL | Status: DC | PRN

## 2016-05-26 MED ORDER — LIDOCAINE-EPINEPHRINE (PF) 2 %-1:200000 IJ SOLN
INTRAMUSCULAR | Status: AC
Start: 2016-05-26 — End: 2016-05-26
  Filled 2016-05-26: qty 20

## 2016-05-26 MED ORDER — MORPHINE SULFATE (PF) 0.5 MG/ML IJ SOLN
INTRAMUSCULAR | Status: AC
  Filled 2016-05-26: qty 10

## 2016-05-26 MED ORDER — DEXAMETHASONE SODIUM PHOSPHATE 4 MG/ML IJ SOLN
INTRAMUSCULAR | Status: DC | PRN
  Administered 2016-05-26: 4 mg via INTRAVENOUS

## 2016-05-26 MED ORDER — MORPHINE SULFATE (PF) 0.5 MG/ML IJ SOLN
INTRAMUSCULAR | Status: DC | PRN
  Administered 2016-05-26: 3 mg via EPIDURAL

## 2016-05-26 MED ORDER — SCOPOLAMINE 1 MG/3DAYS TD PT72: 1.0000 | MEDICATED_PATCH | Freq: Once | TRANSDERMAL | Status: DC

## 2016-05-26 MED ORDER — NALBUPHINE HCL 10 MG/ML IJ SOLN: 5.0000 mg | Freq: Once | INTRAMUSCULAR | Status: AC | PRN

## 2016-05-26 MED ORDER — PHENYLEPHRINE 40 MCG/ML (10ML) SYRINGE FOR IV PUSH (FOR BLOOD PRESSURE SUPPORT): 80.0000 ug | PREFILLED_SYRINGE | INTRAVENOUS | Status: DC | PRN

## 2016-05-26 MED ORDER — NALBUPHINE HCL 10 MG/ML IJ SOLN
INTRAMUSCULAR | Status: AC
  Administered 2016-05-26: 5 mg via INTRAVENOUS
  Filled 2016-05-26: qty 1

## 2016-05-26 MED ORDER — LACTATED RINGERS IV SOLN
500.0000 mL | Freq: Once | INTRAVENOUS | Status: AC
  Administered 2016-05-26: 1000 mL via INTRAVENOUS

## 2016-05-26 MED ORDER — SIMETHICONE 80 MG PO CHEW
80.0000 mg | CHEWABLE_TABLET | Freq: Three times a day (TID) | ORAL | Status: DC
  Administered 2016-05-27 – 2016-05-29 (×6): 80 mg via ORAL
  Filled 2016-05-26 (×6): qty 1

## 2016-05-26 MED ORDER — SENNOSIDES-DOCUSATE SODIUM 8.6-50 MG PO TABS
2.0000 | ORAL_TABLET | ORAL | Status: DC
  Administered 2016-05-27 – 2016-05-28 (×2): 2 via ORAL
  Filled 2016-05-26 (×2): qty 2

## 2016-05-26 MED ORDER — ACETAMINOPHEN 325 MG PO TABS
650.0000 mg | ORAL_TABLET | ORAL | Status: DC | PRN
  Administered 2016-05-28 (×2): 650 mg via ORAL
  Filled 2016-05-26 (×2): qty 2

## 2016-05-26 MED ORDER — MEPERIDINE HCL 25 MG/ML IJ SOLN
INTRAMUSCULAR | Status: AC
  Filled 2016-05-26: qty 1

## 2016-05-26 MED ORDER — LACTATED RINGERS IV SOLN
INTRAVENOUS | Status: DC
  Administered 2016-05-26 (×6): via INTRAVENOUS

## 2016-05-26 MED ORDER — ONDANSETRON HCL 4 MG/2ML IJ SOLN
INTRAMUSCULAR | Status: DC | PRN
  Administered 2016-05-26: 4 mg via INTRAVENOUS

## 2016-05-26 MED ORDER — SCOPOLAMINE 1 MG/3DAYS TD PT72
MEDICATED_PATCH | TRANSDERMAL | Status: DC | PRN
  Administered 2016-05-26: 1 via TRANSDERMAL

## 2016-05-26 MED ORDER — OXYTOCIN 10 UNIT/ML IJ SOLN
INTRAVENOUS | Status: DC | PRN
  Administered 2016-05-26: 40 [IU] via INTRAVENOUS

## 2016-05-26 MED ORDER — METHYLERGONOVINE MALEATE 0.2 MG/ML IJ SOLN: 0.2000 mg | INTRAMUSCULAR | Status: DC | PRN

## 2016-05-26 MED ORDER — COCONUT OIL OIL: 1.0000 "application " | TOPICAL_OIL | Status: DC | PRN

## 2016-05-26 MED ORDER — LACTATED RINGERS IV SOLN
INTRAVENOUS | Status: DC | PRN
  Administered 2016-05-26 (×2): via INTRAVENOUS

## 2016-05-26 MED ORDER — ONDANSETRON HCL 4 MG/2ML IJ SOLN
4.0000 mg | Freq: Four times a day (QID) | INTRAMUSCULAR | Status: DC | PRN
  Administered 2016-05-26: 4 mg via INTRAVENOUS
  Filled 2016-05-26: qty 2

## 2016-05-26 MED ORDER — DEXAMETHASONE SODIUM PHOSPHATE 4 MG/ML IJ SOLN
INTRAMUSCULAR | Status: AC
  Filled 2016-05-26: qty 1

## 2016-05-26 MED ORDER — DIPHENHYDRAMINE HCL 50 MG/ML IJ SOLN: 12.5000 mg | INTRAMUSCULAR | Status: DC | PRN

## 2016-05-26 MED ORDER — ONDANSETRON HCL 4 MG/2ML IJ SOLN
INTRAMUSCULAR | Status: AC
  Filled 2016-05-26: qty 2

## 2016-05-26 MED ORDER — DIPHENHYDRAMINE HCL 25 MG PO CAPS: 25.0000 mg | ORAL_CAPSULE | Freq: Four times a day (QID) | ORAL | Status: DC | PRN

## 2016-05-26 MED ORDER — VENLAFAXINE HCL ER 75 MG PO CP24
75.0000 mg | ORAL_CAPSULE | Freq: Every day | ORAL | Status: DC
  Administered 2016-05-27 – 2016-05-28 (×3): 75 mg via ORAL
  Filled 2016-05-26 (×4): qty 1

## 2016-05-26 MED ORDER — NALBUPHINE HCL 10 MG/ML IJ SOLN
5.0000 mg | INTRAMUSCULAR | Status: DC | PRN
  Administered 2016-05-26: 5 mg via INTRAVENOUS

## 2016-05-26 MED ORDER — CEFAZOLIN SODIUM-DEXTROSE 2-4 GM/100ML-% IV SOLN
2.0000 g | Freq: Once | INTRAVENOUS | Status: AC
  Administered 2016-05-26: 2 g via INTRAVENOUS

## 2016-05-26 MED ORDER — LACTATED RINGERS IV SOLN: 500.0000 mL | INTRAVENOUS | Status: DC | PRN

## 2016-05-26 MED ORDER — NALBUPHINE HCL 10 MG/ML IJ SOLN: 5.0000 mg | INTRAMUSCULAR | Status: DC | PRN

## 2016-05-26 MED ORDER — TERBUTALINE SULFATE 1 MG/ML IJ SOLN: 0.2500 mg | Freq: Once | INTRAMUSCULAR | Status: DC | PRN

## 2016-05-26 MED ORDER — SODIUM CHLORIDE 0.9 % IR SOLN
Status: DC | PRN
  Administered 2016-05-26: 1

## 2016-05-26 MED ORDER — ONDANSETRON HCL 4 MG/2ML IJ SOLN: 4.0000 mg | Freq: Three times a day (TID) | INTRAMUSCULAR | Status: DC | PRN

## 2016-05-26 MED ORDER — BUPIVACAINE HCL 0.25 % IJ SOLN
INTRAMUSCULAR | Status: DC | PRN
  Administered 2016-05-26: 30 mL

## 2016-05-26 MED ORDER — SOD CITRATE-CITRIC ACID 500-334 MG/5ML PO SOLN
30.0000 mL | ORAL | Status: DC | PRN
  Administered 2016-05-26: 30 mL via ORAL
  Filled 2016-05-26: qty 15

## 2016-05-26 MED ORDER — SODIUM BICARBONATE 8.4 % IV SOLN
INTRAVENOUS | Status: DC | PRN
  Administered 2016-05-26 (×3): 5 mL via EPIDURAL
  Administered 2016-05-26: 3 mL via EPIDURAL

## 2016-05-26 MED ORDER — NALOXONE HCL 2 MG/2ML IJ SOSY: 1.0000 ug/kg/h | PREFILLED_SYRINGE | INTRAVENOUS | Status: DC | PRN

## 2016-05-26 MED ORDER — OXYTOCIN 10 UNIT/ML IJ SOLN
INTRAMUSCULAR | Status: AC
  Filled 2016-05-26: qty 4

## 2016-05-26 MED ORDER — DIBUCAINE 1 % RE OINT: 1.0000 "application " | TOPICAL_OINTMENT | RECTAL | Status: DC | PRN

## 2016-05-26 MED ORDER — OXYTOCIN BOLUS FROM INFUSION: 500.0000 mL | Freq: Once | INTRAVENOUS | Status: DC

## 2016-05-26 MED ORDER — ZOLPIDEM TARTRATE 5 MG PO TABS: 5.0000 mg | ORAL_TABLET | Freq: Every evening | ORAL | Status: DC | PRN

## 2016-05-26 MED ORDER — DIPHENHYDRAMINE HCL 25 MG PO CAPS: 25.0000 mg | ORAL_CAPSULE | ORAL | Status: DC | PRN

## 2016-05-26 MED ORDER — OXYCODONE HCL 5 MG PO TABS
10.0000 mg | ORAL_TABLET | ORAL | Status: DC | PRN
  Administered 2016-05-27 – 2016-05-28 (×5): 10 mg via ORAL
  Filled 2016-05-26 (×6): qty 2

## 2016-05-26 MED ORDER — EPHEDRINE 5 MG/ML INJ
10.0000 mg | INTRAVENOUS | Status: DC | PRN
Start: 2016-05-26 — End: 2016-05-26

## 2016-05-26 MED ORDER — SODIUM BICARBONATE 8.4 % IV SOLN
INTRAVENOUS | Status: AC
  Filled 2016-05-26: qty 50

## 2016-05-26 MED ORDER — SIMETHICONE 80 MG PO CHEW: 80.0000 mg | CHEWABLE_TABLET | ORAL | Status: DC | PRN

## 2016-05-26 MED ORDER — LIDOCAINE HCL (PF) 1 % IJ SOLN
INTRAMUSCULAR | Status: DC | PRN
  Administered 2016-05-26: 4 mL
  Administered 2016-05-26: 6 mL via EPIDURAL

## 2016-05-26 MED ORDER — OXYTOCIN 40 UNITS IN LACTATED RINGERS INFUSION - SIMPLE MED
1.0000 m[IU]/min | INTRAVENOUS | Status: DC
  Administered 2016-05-26: 6 m[IU]/min via INTRAVENOUS
  Administered 2016-05-26: 1 m[IU]/min via INTRAVENOUS

## 2016-05-26 MED ORDER — ACETAMINOPHEN 500 MG PO TABS
1000.0000 mg | ORAL_TABLET | Freq: Four times a day (QID) | ORAL | Status: AC
  Administered 2016-05-26 – 2016-05-27 (×3): 1000 mg via ORAL
  Filled 2016-05-26 (×3): qty 2

## 2016-05-26 MED ORDER — OXYTOCIN 40 UNITS IN LACTATED RINGERS INFUSION - SIMPLE MED
2.5000 [IU]/h | INTRAVENOUS | Status: DC
  Filled 2016-05-26: qty 1000

## 2016-05-26 SURGICAL SUPPLY — 35 items
APL SKNCLS STERI-STRIP NONHPOA (GAUZE/BANDAGES/DRESSINGS) ×1
BENZOIN TINCTURE PRP APPL 2/3 (GAUZE/BANDAGES/DRESSINGS) ×3 IMPLANT
CHLORAPREP W/TINT 26ML (MISCELLANEOUS) ×3 IMPLANT
CLAMP CORD UMBIL (MISCELLANEOUS) ×2 IMPLANT
CLOSURE WOUND 1/2 X4 (GAUZE/BANDAGES/DRESSINGS) ×1
CLOTH BEACON ORANGE TIMEOUT ST (SAFETY) ×3 IMPLANT
DRSG OPSITE POSTOP 4X10 (GAUZE/BANDAGES/DRESSINGS) ×3 IMPLANT
ELECT REM PT RETURN 9FT ADLT (ELECTROSURGICAL) ×3
ELECTRODE REM PT RTRN 9FT ADLT (ELECTROSURGICAL) ×1 IMPLANT
GLOVE BIOGEL PI IND STRL 6.5 (GLOVE) ×1 IMPLANT
GLOVE BIOGEL PI IND STRL 7.0 (GLOVE) ×2 IMPLANT
GLOVE BIOGEL PI INDICATOR 6.5 (GLOVE) ×2
GLOVE BIOGEL PI INDICATOR 7.0 (GLOVE) ×4
GLOVE ECLIPSE 6.5 STRL STRAW (GLOVE) ×3 IMPLANT
GOWN STRL REUS W/TWL LRG LVL3 (GOWN DISPOSABLE) ×9 IMPLANT
HEMOSTAT SURGICEL 4X8 (HEMOSTASIS) ×2 IMPLANT
LIQUID BAND (GAUZE/BANDAGES/DRESSINGS) IMPLANT
NEEDLE HYPO 22GX1.5 SAFETY (NEEDLE) ×2 IMPLANT
NS IRRIG 1000ML POUR BTL (IV SOLUTION) ×3 IMPLANT
PACK C SECTION WH (CUSTOM PROCEDURE TRAY) ×3 IMPLANT
PAD ABD 7.5X8 STRL (GAUZE/BANDAGES/DRESSINGS) ×3 IMPLANT
PAD OB MATERNITY 4.3X12.25 (PERSONAL CARE ITEMS) ×3 IMPLANT
PENCIL BUTTON HOLSTER BLD 10FT (ELECTRODE) ×2 IMPLANT
PENCIL SMOKE EVAC W/HOLSTER (ELECTROSURGICAL) ×3 IMPLANT
RTRCTR C-SECT PINK 25CM LRG (MISCELLANEOUS) ×3 IMPLANT
STRIP CLOSURE SKIN 1/2X4 (GAUZE/BANDAGES/DRESSINGS) ×2 IMPLANT
SUT VIC AB 0 CT1 27 (SUTURE) ×6
SUT VIC AB 0 CT1 27XBRD ANBCTR (SUTURE) ×2 IMPLANT
SUT VIC AB 0 CTX 36 (SUTURE) ×9
SUT VIC AB 0 CTX36XBRD ANBCTRL (SUTURE) ×3 IMPLANT
SUT VIC AB 2-0 CT1 27 (SUTURE) ×3
SUT VIC AB 2-0 CT1 TAPERPNT 27 (SUTURE) ×1 IMPLANT
SUT VIC AB 4-0 KS 27 (SUTURE) ×3 IMPLANT
SYR CONTROL 10ML LL (SYRINGE) ×2 IMPLANT
TOWEL OR 17X24 6PK STRL BLUE (TOWEL DISPOSABLE) ×3 IMPLANT

## 2016-05-26 NOTE — Lactation Note (Signed)
This note was copied from a baby's chart. Lactation Consultation Note  Patient Name: Debbie Melendez Today's Date: 05/26/2016 Reason for consult: Initial assessment Baby at 4 hr of life. Baby was sleeping sts with mom upon entry. Mom is drowsy. FOB suggested lactation help mom latch baby. Tried waking baby but as soon as he went back to mom he went back to being sleepy. He opens mouth wide, flanges lips, and cups tongue. He was able to suck for about 2 minutes before falling back asleep. Discussed baby behavior, feeding frequency, baby belly size, voids, wt loss, breast changes, and nipple care. Demonstrated manual expression, colostrum noted bilaterally, spoon in room. Given lactation handouts. Aware of OP services and support group. Mom may need more teaching tomorrow. She will call as needed.     Maternal Data Has patient been taught Hand Expression?: Yes Does the patient have breastfeeding experience prior to this delivery?: No  Feeding Feeding Type: Breast Fed Length of feed: 2 min  LATCH Score/Interventions Latch: Repeated attempts needed to sustain latch, nipple held in mouth throughout feeding, stimulation needed to elicit sucking reflex. Intervention(s): Skin to skin;Teach feeding cues;Waking techniques Intervention(s): Adjust position;Assist with latch;Breast massage;Breast compression  Audible Swallowing: None Intervention(s): Skin to skin;Hand expression  Type of Nipple: Everted at rest and after stimulation  Comfort (Breast/Nipple): Soft / non-tender     Hold (Positioning): Full assist, staff holds infant at breast Intervention(s): Support Pillows;Position options  LATCH Score: 5  Lactation Tools Discussed/Used WIC Program: Yes Pump Review: Setup, frequency, and cleaning;Milk Storage Initiated by:: ES Date initiated:: 05/26/16   Consult Status Consult Status: Follow-up Date: 05/27/16 Follow-up type: In-patient    Rulon Eisenmenger 05/26/2016, 9:50  PM

## 2016-05-26 NOTE — Progress Notes (Signed)
OB PN:  S: Pt feeling contractions 3/10.  O: BP 133/85 (BP Location: Right Arm)   Pulse 75   Temp 99 F (37.2 C) (Oral)   Resp 18   Ht 5\' 2"  (1.575 m)   Wt 64.9 kg (143 lb)   BMI 26.16 kg/m   FHT: 130bpm, moderate variablity, + accels, no decels Toco: q61min SVE: 4/80/-2, AROM clear fluid  A/P: 30 y.o. G1P0 @ [redacted]w[redacted]d 1. FWB: Cat. I 2. Labor: expectant management Pain: epidural if possible, pending CBC, IV pain medicine upon request GBS: negative Gestational Thrombocytopenia: CBC pending GERD: continue Zantac prn HSV: taking valtrex, asymptomatic, no outbreak noted on admission  Debbie Melendez, [place] [phone] (pager) [phone] (office)

## 2016-05-26 NOTE — Anesthesia Preprocedure Evaluation (Addendum)
Anesthesia Evaluation  Patient identified by MRN, date of birth, ID band Patient awake    Reviewed: Allergy & Precautions, H&P , Patient's Chart, lab work & pertinent test results  Airway Mallampati: II  TM Distance: >3 FB Neck ROM: full    Dental no notable dental hx.    Pulmonary    Pulmonary exam normal breath sounds clear to auscultation       Cardiovascular Exercise Tolerance: Good  Rhythm:regular Rate:Normal     Neuro/Psych    GI/Hepatic   Endo/Other    Renal/GU      Musculoskeletal   Abdominal   Peds  Hematology   Anesthesia Other Findings   Reproductive/Obstetrics                             Anesthesia Physical Anesthesia Plan  ASA: II  Anesthesia Plan: Epidural   Post-op Pain Management:    Induction:   Airway Management Planned:   Additional Equipment:   Intra-op Plan:   Post-operative Plan:   Informed Consent: I have reviewed the patients History and Physical, chart, labs and discussed the procedure including the risks, benefits and alternatives for the proposed anesthesia with the patient or authorized representative who has indicated his/her understanding and acceptance.   Dental Advisory Given  Plan Discussed with:   Anesthesia Plan Comments: (Labs checked- platelets confirmed with RN in room. Fetal heart tracing, per RN, reported to be stable enough for sitting procedure. Discussed epidural, and patient consents to the procedure:  included risk of possible headache,backache, failed block, allergic reaction, and nerve injury. This patient was asked if she had any questions or concerns before the procedure started.)        Anesthesia Quick Evaluation  

## 2016-05-26 NOTE — MAU Note (Signed)
PT  SAYS SHE  STARTED  HURTING  BAD   AT  12 MN.      PT  SAYS  SHE  WAS 2  CM  TODAY  IN OFFICE .      HAS  HX   OF  HSV-  LAST OUTBREAK-     2-3   YEARS  AGO-  TAKES  VALTREX.   DENIES  MRSA.  GBS- NEG

## 2016-05-26 NOTE — H&P (Addendum)
Debbie Melendez is a 30 y.o. female presenting for labor.  She reports regular contractions, no LOF, brown discharge but no active bleeding. Reports good FM.  Pt reports being 1cm in the office the other day.  CNM in triage reports that the pt is contracting every 1-3 minutes.  Pt denies any prodromal HSV sxs.  OB History    Gravida Para Term Preterm AB Living   1             SAB TAB Ectopic Multiple Live Births                 Past Medical History:  Diagnosis Date  . Anemia   . Strep throat   . Ulcerative colitis    Past Surgical History:  Procedure Laterality Date  . ADENOIDECTOMY    . TONSILLECTOMY     Family History: family history includes Cancer in her other; Heart failure in her other; Hypertension in her other; Thyroid disease in her other. Social History:  reports that she has been smoking.  She does not have any smokeless tobacco history on file. She reports that she drinks alcohol. She reports that she does not use drugs.     Maternal Diabetes: No Genetic Screening: Normal Maternal Ultrasounds/Referrals: Abnormal:  Findings:   Isolated EIF (echogenic intracardiac focus) Fetal Ultrasounds or other Referrals:  None Maternal Substance Abuse:  No Significant Maternal Medications:  Meds include: Other: valtrex Significant Maternal Lab Results:  Lab values include: Group B Strep negative Other Comments:  None  ROS  Non-contributory  History Dilation: 3.5 Effacement (%): 70 Station: -1 Exam by:: D. Callaway RN Blood pressure 138/88, pulse 76, temperature 98.1 F (36.7 C), temperature source Oral, resp. rate 20, height 5\' 2"  (1.575 m), weight 143 lb 4 oz (65 kg). Exam Physical Exam   Lungs CTA CV RRR Abd gravid, NT Ext no calf tenderness VE 3-4/80/-1 (by me now) FHT 120s, +accels, no decels, mod variability Speculum - no lesions and no ext genitalia lesions  Prenatal labs: ABO, Rh:  A + Antibody:  Ab neg Rubella:  Immune RPR:   NR HBsAg:   neg HIV:    neg GBS: Negative (07/06 0000)   Assessment/Plan: P0 at 38 2/7wks dated by LMP c/w early ultrasound being admitted in early labor.  Fetal status is reassuring.  Pt has been taking valtrex and no s/sxs of outbreak.  Pt reports low platelets during pregnancy, will recheck with admission labs.  Shanautica Forker Y 05/26/2016, 5:25 AM

## 2016-05-26 NOTE — Op Note (Signed)
PreOp Diagnosis: 1) Intrauterine pregnancy @ [redacted]w[redacted]d 2) Arrest of descent  PostOp Diagnosis: same Procedure: Primary LTCS Surgeon: Dr. Myna Hidalgo Anesthesia: epidural Complications: none EBL: 750cc UOP: 750cc Fluids: 2800   Findings: Female infant from vertex presentation, normal uterus tubes and ovaries bilaterally.  PROCEDURE:  Informed consent was obtained from the patient with risks, benefits, complications, treatment options, and expected outcomes discussed with the patient.  The patient concurred with the proposed plan, giving informed consent with form signed.   The patient was taken to Operating Room, and identified with the procedure verified as C-Section Delivery with Time Out. With induction of anesthesia, the patient was prepped and draped in the usual sterile fashion. A Pfannenstiel incision was made and carried down through the subcutaneous tissue to the fascia. The fascia was incised in the midline and extended transversely. The superior aspect of the fascial incision was grasped with Kochers elevated and the underlying muscle dissected off. The inferior aspect of the facial incision was in similar fashion, grasped elevated and rectus muscles dissected off. The peritoneum was identified and entered. Peritoneal incision was extended longitudinally. The utero-vesical peritoneal reflection was identified and incised transversely with the [facility] scissors, the incision extended laterally, the bladder flap created digitally. A low transverse uterine incision was made and the infants head delivered atraumatically. After the umbilical cord was clamped and cut cord blood was obtained for evaluation.   The placenta was removed intact and appeared normal. The uterine outline, tubes and ovaries appeared normal. The uterine incision was closed with running locked sutures of 0 Vicryl and a second layer of the same stitch was used in an imbricating fashion.  Excellent hemostasis was obtained.   Surgicel was placed.  The pericolic gutters were then cleared of all clots and debris.  The peritoneum was closed in a running fashion.  The fascia was then reapproximated with running sutures of 0 Vicryl. The skin was closed with 4-0 vicryl in a subcuticular fashion.  Instrument, sponge, and needle counts were correct prior the abdominal closure and at the conclusion of the case. The patient was taken to recovery in stable condition.  Myna Hidalgo, DO [phone] (pager) [phone] (office)

## 2016-05-26 NOTE — Progress Notes (Signed)
OB PN:  S: More comfortable with epdural.  Feeling some pressure in her bottom  O: BP 109/68   Pulse 67   Temp 97.4 F (36.3 C) (Oral)   Resp 20   Ht 5\' 2"  (1.575 m)   Wt 64.9 kg (143 lb)   SpO2 100%   BMI 26.16 kg/m   FHT: 130bpm, moderate variablity, + accels, no decels Toco: q55min SVE: C/C/+2  A/P: 30 y.o. G1P0 @ [redacted]w[redacted]d 1. FWB: Cat. I 2. Labor: expectant management Pain: continue epidural GBS: negative Gestational Thrombocytopenia: CBC pending GERD: continue Zantac prn HSV: taking valtrex, asymptomatic, no outbreak noted on admission  Myna Hidalgo, [place] [phone] (pager) [phone] (office)

## 2016-05-26 NOTE — Progress Notes (Signed)
PT has been complete for over 4 hours and actively pushing for over 2 hours with minimal to no descent.  At this point, have discussed plans for primary C-section due to arrest of descent. Risk benefits and alternatives of cesarean section were discussed with the patient including but not limited to infection, bleeding, damage to bowel , bladder and baby with the need for further surgery. Pt voiced understanding and desires to proceed.   Debbie Hidalgo, DO [phone] (pager) [phone] (office)

## 2016-05-26 NOTE — Anesthesia Pain Management Evaluation Note (Signed)
  CRNA Pain Management Visit Note  Patient: Debbie Melendez, 30 y.o., female  "Hello I am a member of the anesthesia team at [facility]. We have an anesthesia team available at all times to provide care throughout the hospital, including epidural management and anesthesia for C-section. I don't know your plan for the delivery whether it a natural birth, water birth, IV sedation, nitrous supplementation, doula or epidural, but we want to meet your pain goals."   1.Was your pain managed to your expectations on prior hospitalizations? NA   2.What is your expectation for pain management during this hospitalization?     epidural  3.How can we help you reach that goal?epidural Record the patient's initial score and the patient's pain goal.   Pain:2  Pain Goal:4  [facility] wants you to be able to say your pain was always managed very well.  [facility] 05/26/2016

## 2016-05-26 NOTE — Transfer of Care (Signed)
Immediate Anesthesia Transfer of Care Note  Patient: Debbie Melendez  Procedure(s) Performed: Procedure(s): CESAREAN SECTION (N/A)  Patient Location: PACU  Anesthesia Type:Epidural  Level of Consciousness: awake, alert , oriented and patient cooperative  Airway & Oxygen Therapy: Patient Spontanous Breathing  Post-op Assessment: Report given to RN and Post -op Vital signs reviewed and stable  Post vital signs: Reviewed and stable  Last Vitals:  BP HR 89 RR 109/75 TEMP 98.7 POX 100  Last Pain: 2     Pain goal 4  Complications: No apparent anesthesia complications

## 2016-05-26 NOTE — Anesthesia Procedure Notes (Signed)
Epidural Patient location during procedure: OB  Preanesthetic Checklist Completed: patient identified, site marked, surgical consent, pre-op evaluation, timeout performed, IV checked, risks and benefits discussed and monitors and equipment checked  Epidural Patient position: sitting Prep: site prepped and draped and DuraPrep Patient monitoring: continuous pulse ox and blood pressure Approach: midline Location: L3-L4 Injection technique: LOR air  Needle:  Needle type: Tuohy  Needle gauge: 17 G Needle length: 9 cm and 9 Needle insertion depth: 4 cm Catheter type: closed end flexible Catheter size: 19 Gauge Catheter at skin depth: 10 cm Test dose: negative  Assessment Events: blood not aspirated, injection not painful, no injection resistance, negative IV test and no paresthesia  Additional Notes Dosing of Epidural:  1st dose, through catheter .............................................  Xylocaine 40 mg  2nd dose, through catheter, after waiting 3 minutes.........Xylocaine 60 mg    As each dose occurred, patient was free of IV sx; and patient exhibited no evidence of SA injection.  Patient is more comfortable after epidural dosed. Please see RN's note for documentation of vital signs,and FHR which are stable.  Patient reminded not to try to ambulate with numb legs, and that an RN must be present when she attempts to get up.       

## 2016-05-27 ENCOUNTER — Encounter (HOSPITAL_COMMUNITY): Payer: Self-pay | Admitting: Obstetrics & Gynecology

## 2016-05-27 LAB — CBC
HCT: 28.2 % — ABNORMAL LOW (ref 36.0–46.0)
HEMOGLOBIN: 9.8 g/dL — AB (ref 12.0–15.0)
MCH: 33.7 pg (ref 26.0–34.0)
MCHC: 34.8 g/dL (ref 30.0–36.0)
MCV: 96.9 fL (ref 78.0–100.0)
PLATELETS: 84 10*3/uL — AB (ref 150–400)
RBC: 2.91 MIL/uL — AB (ref 3.87–5.11)
RDW: 14.7 % (ref 11.5–15.5)
WBC: 14.9 10*3/uL — AB (ref 4.0–10.5)

## 2016-05-27 NOTE — Progress Notes (Signed)
Subjective: Postpartum Day 1: Cesarean Delivery Patient reports tolerating PO and no problems voiding.    Objective: Vital signs in last 24 hours: Temp:  [97.8 F (36.6 C)-99.4 F (37.4 C)] 97.9 F (36.6 C) (07/29 1215) Pulse Rate:  [43-108] 78 (07/29 1215) Resp:  [16-26] 18 (07/29 1215) BP: (93-130)/(53-87) 103/67 (07/29 1215) SpO2:  [84 %-100 %] 100 % (07/29 1215)  Physical Exam:  General: alert and cooperative Lochia: appropriate Uterine Fundus: firm Incision: healing well, no significant drainage, no dehiscence DVT Evaluation: Negative Homan's sign.   Recent Labs  05/26/16 0530 05/27/16 0515  HGB 12.8 9.8*  HCT 36.0 28.2*    Assessment/Plan: Status post Cesarean section. Doing well postoperatively.  Continue current care.  Debbie Melendez A 05/27/2016, 1:45 PM

## 2016-05-27 NOTE — Anesthesia Postprocedure Evaluation (Signed)
Anesthesia Post Note  Patient: Debbie Melendez  Procedure(s) Performed: Procedure(s) (LRB): CESAREAN SECTION (N/A)  Patient location during evaluation: Mother Baby Anesthesia Type: Epidural Level of consciousness: awake and alert Pain management: pain level controlled Vital Signs Assessment: post-procedure vital signs reviewed and stable Respiratory status: spontaneous breathing and nonlabored ventilation Cardiovascular status: stable Postop Assessment: no headache, no backache, no signs of nausea or vomiting, patient able to bend at knees, epidural receding and adequate PO intake Anesthetic complications: no     Last Vitals:  Vitals:   05/27/16 0350 05/27/16 0815  BP: (!) 93/53 110/73  Pulse: (!) 107 81  Resp: 18 16  Temp:  36.9 C    Last Pain:  Vitals:   05/27/16 0815  TempSrc: Oral  PainSc:    Pain Goal: Patients Stated Pain Goal: 6 (05/26/16 0725)               Laban Emperor

## 2016-05-27 NOTE — Progress Notes (Signed)
CSW acknowledged consult and attempted to meet with MOB.  MOB had several family members visiting.  CSW will attempt to meet with MOB at a later time.   Blaine Hamper, MSW, LCSW Clinical Social Work [phone]

## 2016-05-27 NOTE — Lactation Note (Signed)
This note was copied from a baby's chart. Lactation Consultation Note  Baby was cyanotic at delivery. Though this is an L2 according to [place] [employer] recent data recent suggests that infants exposed in utero to SRIs such as venlafaxine may have adverse effects such as cyanosis immediately after delivery which this baby did.  Baby has been very sleepy and not interested in eating, Mom was able to express     1 ml of BM and spoon fed it to baby.  Encouraged  her to continue this if baby is not attaching to the breast.  Double electric breast pump will be set up to support supply.  Follow-up tomorrow  Patient Name: Boy Sonna Finkel Today's Date: 05/27/2016 Reason for consult: Follow-up assessment   Maternal Data Has patient been taught Hand Expression?: Yes  Feeding Feeding Type: Breast Milk  LATCH Score/Interventions Latch: Too sleepy or reluctant, no latch achieved, no sucking elicited.  Audible Swallowing: None  Type of Nipple: Flat  Comfort (Breast/Nipple): Soft / non-tender     Hold (Positioning): No assistance needed to correctly position infant at breast.  LATCH Score: 5  Lactation Tools Discussed/Used     Consult Status Consult Status: Follow-up Follow-up type: In-patient    Soyla Dryer 05/27/2016, 2:32 PM

## 2016-05-28 DIAGNOSIS — Z98891 History of uterine scar from previous surgery: Secondary | ICD-10-CM

## 2016-05-28 MED ORDER — OXYCODONE-ACETAMINOPHEN 5-325 MG PO TABS
2.0000 | ORAL_TABLET | ORAL | Status: DC | PRN
  Administered 2016-05-28: 2 via ORAL
  Filled 2016-05-28: qty 2

## 2016-05-28 MED ORDER — HYDROMORPHONE HCL 2 MG PO TABS
2.0000 mg | ORAL_TABLET | ORAL | Status: DC | PRN
  Administered 2016-05-28 – 2016-05-29 (×5): 2 mg via ORAL
  Filled 2016-05-28 (×5): qty 1

## 2016-05-28 MED ORDER — OXYCODONE-ACETAMINOPHEN 5-325 MG PO TABS: 1.0000 | ORAL_TABLET | ORAL | Status: DC | PRN

## 2016-05-28 MED ORDER — HYDROMORPHONE HCL 2 MG PO TABS
4.0000 mg | ORAL_TABLET | ORAL | Status: DC | PRN
  Filled 2016-05-28: qty 2

## 2016-05-28 NOTE — Lactation Note (Signed)
This note was copied from a baby's chart. Lactation Consultation Note  Patient Name: Debbie Melendez Today's Date: 05/28/2016 Reason for consult: Follow-up assessment Baby at 47 hr of life. Mom is no longer latching baby because "he does not stay latched more than a minute". She is alternating breast milk and formula per MD. Upon entry mom was using the DEBP. She had the flanges pressed so hard onto her breast the nipples were white and there were dark red red rings on the breast. Showed mom how to lightly apply the pump, turn down suction, and tip the flanges forward so the milk runs into the bottle. Mom pumped for 15 minutes and got 64ml. Parents needed teaching on milk handling and storage. They had questions about how much and when to feed baby. Chart with bottle feeding amount guidelines given. Stressed every other feeding should be breast milk. Baby needs to be eating every 3 hr unless he cues to be fed before the 3hr mark. Even if baby is sleeping he needs to eat every 3 hr. Baby is currently on dbl photo. His "glasses" were on the top of his head, he was in the top corner of the basinet, one light was off to the side, and the other light was at the bottom of the basinet. Showed FOB how to place the lights and glasses. Parents voiced understanding of how, when, and amounts to feed baby along with proper placement of lights. They are aware of lactation services and support group. Mom will call as needed.   Maternal Data    Feeding Feeding Type: Bottle Fed - Formula  LATCH Score/Interventions                      Lactation Tools Discussed/Used Pump Review: Setup, frequency, and cleaning;Milk Storage Initiated by:: ES   Consult Status Consult Status: Follow-up Date: 05/29/16 Follow-up type: In-patient    Rulon Eisenmenger 05/28/2016, 4:30 PM

## 2016-05-28 NOTE — Progress Notes (Addendum)
  Debbie Melendez, Debbie Melendez DOB Mar 26, 1986  Subjective: Postpartum Day 2: Cesarean Delivery Patient reports incisional pain, tolerating PO, + flatus and no problems voiding.  She denies lightheadedness or dizziness with ambulation.    Objective: Vital signs in last 24 hours: Temp:  [98.5 F (36.9 C)] 98.5 F (36.9 C) (07/30 0511) Pulse Rate:  [75] 75 (07/30 0511) Resp:  [18] 18 (07/30 0511) BP: (123)/(74) 123/74 (07/30 0511)  Physical Exam:  General: alert, cooperative and no distress Lochia: appropriate Uterine Fundus: firm Incision: honeycomb and steri strips saturated with dried blood.  RN advised to change dressing.  DVT Evaluation: No evidence of DVT seen on physical exam. No significant calf/ankle edema.   Recent Labs  05/26/16 0530 05/27/16 0515  HGB 12.8 9.8*  HCT 36.0 28.2*   CBC    Component Value Date/Time   WBC 14.9 (H) 05/27/2016 0515   RBC 2.91 (L) 05/27/2016 0515   HGB 9.8 (L) 05/27/2016 0515   HCT 28.2 (L) 05/27/2016 0515   PLT 84 (L) 05/27/2016 0515   MCV 96.9 05/27/2016 0515   MCH 33.7 05/27/2016 0515   MCHC 34.8 05/27/2016 0515   RDW 14.7 05/27/2016 0515   MONOABS 0.4 10/06/2008 1625   EOSABS 0.1 10/06/2008 1625   BASOSABS 0.0 10/06/2008 1625   . pantoprazole  40 mg Oral BID AC  . prenatal multivitamin  1 tablet Oral Q1200  . scopolamine  1 patch Transdermal Once  . senna-docusate  2 tablet Oral Q24H  . simethicone  80 mg Oral TID PC  . simethicone  80 mg Oral Q24H  . venlafaxine XR  75 mg Oral Q1200    Assessment/Plan: Status post Cesarean section.  Change dressing Changed pain medication from oxycodone to percocet.  Pain precautions reviewed.  On effexor for anxiety Will be discharged home tomorrow and circumcision of neonate to be performed before discharge tomorrow.  Mild anemia c/w prenatal vitamin tabs  Debbie Melendez 05/28/2016, 1:44 PM

## 2016-05-29 ENCOUNTER — Encounter (HOSPITAL_COMMUNITY): Payer: Self-pay

## 2016-05-29 LAB — CBC
HCT: 27.7 % — ABNORMAL LOW (ref 36.0–46.0)
Hemoglobin: 9.5 g/dL — ABNORMAL LOW (ref 12.0–15.0)
MCH: 33.6 pg (ref 26.0–34.0)
MCHC: 34.3 g/dL (ref 30.0–36.0)
MCV: 97.9 fL (ref 78.0–100.0)
PLATELETS: 109 10*3/uL — AB (ref 150–400)
RBC: 2.83 MIL/uL — AB (ref 3.87–5.11)
RDW: 14.5 % (ref 11.5–15.5)
WBC: 9.3 10*3/uL (ref 4.0–10.5)

## 2016-05-29 MED ORDER — ACETAMINOPHEN 325 MG PO TABS: 650.0000 mg | ORAL_TABLET | ORAL | 1 refills | Status: AC | PRN

## 2016-05-29 MED ORDER — HYDROMORPHONE HCL 2 MG PO TABS: ORAL_TABLET | ORAL | 0 refills | Status: DC

## 2016-05-29 NOTE — Discharge Summary (Signed)
OB Discharge Summary     Patient Name: Debbie Melendez DOB: [birth year] MRN: [id number]  Date of admission: 05/26/2016 Delivering MD: Myna Hidalgo   Date of discharge: 05/29/2016  Admitting diagnosis: 38 WEEKS CTX Intrauterine pregnancy: [redacted]w[redacted]d     Secondary diagnosis:  Principal Problem:   Status post primary low transverse cesarean section Active Problems:   Normal labor  Additional problems: Anxiety      Discharge diagnosis: Term Pregnancy Delivered                                                                                                Post partum procedures:None  Augmentation: None  Complications: None  Hospital course:  Onset of Labor With Unplanned C/S  30 y.o. yo G1P1001 at [redacted]w[redacted]d was admitted in Active Labor on 05/26/2016. Patient had a labor course significant for Arrest of descent . Membrane Rupture Time/Date: 6:45 AM ,05/26/2016   The patient went for cesarean section due to Arrest of Descent, and delivered a Viable infant,05/26/2016  Details of operation can be found in separate operative note. Patient had an uncomplicated postpartum course.  She is ambulating,tolerating a regular diet, passing flatus, and urinating well.  Patient is discharged home in stable condition 05/29/16.   Physical exam Vitals:   05/27/16 1215 05/28/16 0511 05/28/16 1801 05/29/16 0611  BP: 103/67 123/74 121/73 138/79  Pulse: 78 75 78 79  Resp: Temp: 97.9 F (36.6 C) 98.5 F (36.9 C) 97.9 F (36.6 C) 98.6 F (37 C)  TempSrc: Oral Oral Oral Oral  SpO2: 100%     Weight:      Height:       General: alert, cooperative and no distress Lochia: appropriate Uterine Fundus: firm Incision: Healing well with no significant drainage DVT Evaluation: No evidence of DVT seen on physical exam. Labs: Lab Results  Component Value Date   WBC 9.3 05/29/2016   HGB 9.5 (L) 05/29/2016   HCT 27.7 (L) 05/29/2016   MCV 97.9 05/29/2016   PLT 109 (L) 05/29/2016   CMP Latest Ref Rng  & Units 10/06/2008  Glucose 70 - 99 mg/dL 88  BUN 6 - 23 mg/dL 9  Creatinine 0.4 - 1.2 mg/dL 0.8  Sodium 409 - 811 meq/L 138  Potassium 3.5 - 5.1 meq/L 4.0  Chloride 96 - 112 meq/L 103  CO2 19 - 32 meq/L 30  Calcium 8.4 - 10.5 mg/dL 9.1  Total Protein 6.0 - 8.3 g/dL 6.4  Total Bilirubin 0.3 - 1.2 mg/dL 1.0  Alkaline Phos 39 - 117 units/L 27(L)  AST 0 - 37 units/L 17  ALT 0 - 35 units/L 10    Discharge instruction: per After Visit Summary and "Baby and Me Booklet".  After visit meds:    Medication List    TAKE these medications   acetaminophen 325 MG tablet Commonly known as:  TYLENOL Take 2 tablets (650 mg total) by mouth every 4 (four) hours as needed (for pain scale < 4).   HYDROmorphone 2 MG tablet Commonly known as:  DILAUDID 1-2 po every 6 hours  as needed for severe pain   Magnesium 250 MG Tabs Take 500 mg by mouth daily.   prenatal multivitamin Tabs tablet Take 1 tablet by mouth daily at 12 noon.   ranitidine 150 MG tablet Commonly known as:  ZANTAC Take 150 mg by mouth 2 (two) times daily.   valACYclovir 500 MG tablet Commonly known as:  VALTREX Take 500 mg by mouth every 12 (twelve) hours.   venlafaxine XR 75 MG 24 hr capsule Commonly known as:  EFFEXOR-XR Take 75 mg by mouth daily.       Diet: routine diet  Activity: Advance as tolerated. Pelvic rest for 6 weeks.   Outpatient follow up:2 weeks Follow up Appt:No future appointments. Follow up Visit:No Follow-up on file.  Postpartum contraception: Not Discussed  Newborn Data: Live born female  Birth Weight: 6 lb 14.1 oz (3120 g) APGAR: 7, 8  Baby Feeding: Breast Disposition:home with mother   05/29/2016 Jessee Avers., MD

## 2016-05-29 NOTE — Clinical Social Work Maternal (Signed)
  CLINICAL SOCIAL WORK MATERNAL/CHILD NOTE  Patient Details  Name: Debbie Melendez MRN: [id number] Date of Birth: 09-25-1986  Date:  05/29/2016  Clinical Social Worker Initiating Note:  Loren Racer, [place] Date/ Time Initiated:  05/29/16/1005     Child's Name:  Debbie Melendez   Legal Guardian:  Mother   Need for Interpreter:  None   Date of Referral:  05/26/16     Reason for Referral:  Other (Comment)   Referral Source:  [facility]   Address:  [place], [place] [address]  Phone number:  [phone]   Household Members:  Self, Spouse   Natural Supports (not living in the home):  Extended Family   Professional Supports:     Employment: Full-time   Type of Work:   N/A  Education:  Engineer, maintenance Resources:  Multimedia programmer   Other Resources:      Cultural/Religious Considerations Which May Impact Care:  none  Strengths:  Ability to meet basic needs , Pediatrician chosen , Compliance with medical plan , Home prepared for child , Understanding of illness   Risk Factors/Current Problems:  None   Cognitive State:  Alert , Able to Concentrate , Linear Thinking    Mood/Affect:  Calm , Comfortable , Happy    CSW Assessment: CSW met with MOB briefly to offer support and assess needs due to H/O anxiety. MOB currently on Effexor. MOB reports Effexor works well for her and her issue has been panic attacks in the past. She reports it was suggested she switch meds at one point in her pregnancy, but this was then reconsidered as the Effexor has been managing her symptoms well.  MOB reports to have good supports and denies other concerns. CSW reviewed Feelings After Birth education/handout and discussed PPD, PP anxiety vs. Baby blues. MOB appreciated check in and agrees to follow up with her MD as needed.   CSW Plan/Description:  Information/Referral to [employer] , Patient/Family Education     Armstead Peaks,  [place] 05/29/2016, 10:08 AM

## 2016-05-29 NOTE — Lactation Note (Addendum)
This note was copied from a baby's chart. Lactation Consultation Note: Mother is pumping and bottle feeding only. Infant is at 6 % weight loss. Discussed engorgement and advised to massage and ice breast. Mothers breast are full. Advised to pump every 2-3 hours for 15 mins . Discussed safe collection and storage of breastmilk.  Mother has a Medela pump at home. Advised mother to supplement infant with ebm as much as needed. Mother informed of available lactation services and community support.   Patient Name: Debbie Melendez Today's Date: 05/29/2016     Maternal Data    Feeding    LATCH Score/Interventions                      Lactation Tools Discussed/Used     Consult Status      Debbie Melendez 05/29/2016, 8:50 AM

## 2016-07-13 ENCOUNTER — Other Ambulatory Visit (HOSPITAL_COMMUNITY)
Admission: RE | Admit: 2016-07-13 | Discharge: 2016-07-13 | Disposition: A | Discharge status: Home / Self Care | Payer: PRIVATE HEALTH INSURANCE | Source: Ambulatory Visit | Attending: Obstetrics & Gynecology | Admitting: Obstetrics & Gynecology

## 2016-07-13 ENCOUNTER — Other Ambulatory Visit: Payer: Self-pay | Admitting: Obstetrics & Gynecology

## 2016-07-13 DIAGNOSIS — Z1151 Encounter for screening for human papillomavirus (HPV): Secondary | ICD-10-CM | POA: Diagnosis not present

## 2016-07-13 DIAGNOSIS — Z01411 Encounter for gynecological examination (general) (routine) with abnormal findings: Secondary | ICD-10-CM | POA: Diagnosis present

## 2016-07-17 LAB — CYTOLOGY - PAP

## 2018-12-15 ENCOUNTER — Encounter (HOSPITAL_COMMUNITY): Payer: Self-pay | Admitting: Emergency Medicine

## 2018-12-15 ENCOUNTER — Emergency Department (HOSPITAL_COMMUNITY): Payer: No Typology Code available for payment source | Admitting: Certified Registered Nurse Anesthetist

## 2018-12-15 ENCOUNTER — Emergency Department (HOSPITAL_COMMUNITY): Payer: No Typology Code available for payment source

## 2018-12-15 ENCOUNTER — Observation Stay (HOSPITAL_COMMUNITY)
Admission: EM | Admit: 2018-12-15 | Discharge: 2018-12-16 | Disposition: A | Discharge status: Home / Self Care | Payer: No Typology Code available for payment source | Attending: Surgery | Admitting: Surgery

## 2018-12-15 ENCOUNTER — Encounter (HOSPITAL_COMMUNITY)
Admission: EM | Disposition: A | Discharge status: Home / Self Care | Payer: Self-pay | Source: Home / Self Care | Attending: Emergency Medicine

## 2018-12-15 ENCOUNTER — Other Ambulatory Visit: Payer: Self-pay

## 2018-12-15 DIAGNOSIS — Z880 Allergy status to penicillin: Secondary | ICD-10-CM | POA: Diagnosis not present

## 2018-12-15 DIAGNOSIS — Z791 Long term (current) use of non-steroidal anti-inflammatories (NSAID): Secondary | ICD-10-CM | POA: Insufficient documentation

## 2018-12-15 DIAGNOSIS — K810 Acute cholecystitis: Secondary | ICD-10-CM | POA: Diagnosis present

## 2018-12-15 DIAGNOSIS — K819 Cholecystitis, unspecified: Secondary | ICD-10-CM

## 2018-12-15 DIAGNOSIS — K8010 Calculus of gallbladder with chronic cholecystitis without obstruction: Secondary | ICD-10-CM | POA: Insufficient documentation

## 2018-12-15 DIAGNOSIS — F329 Major depressive disorder, single episode, unspecified: Secondary | ICD-10-CM | POA: Diagnosis not present

## 2018-12-15 DIAGNOSIS — K589 Irritable bowel syndrome without diarrhea: Secondary | ICD-10-CM | POA: Insufficient documentation

## 2018-12-15 DIAGNOSIS — Z881 Allergy status to other antibiotic agents status: Secondary | ICD-10-CM | POA: Diagnosis not present

## 2018-12-15 DIAGNOSIS — K8000 Calculus of gallbladder with acute cholecystitis without obstruction: Principal | ICD-10-CM | POA: Insufficient documentation

## 2018-12-15 DIAGNOSIS — F419 Anxiety disorder, unspecified: Secondary | ICD-10-CM | POA: Insufficient documentation

## 2018-12-15 DIAGNOSIS — Z79899 Other long term (current) drug therapy: Secondary | ICD-10-CM | POA: Insufficient documentation

## 2018-12-15 HISTORY — PX: CHOLECYSTECTOMY: SHX55

## 2018-12-15 HISTORY — DX: Depression, unspecified: F32.A

## 2018-12-15 HISTORY — DX: Major depressive disorder, single episode, unspecified: F32.9

## 2018-12-15 HISTORY — DX: Anxiety disorder, unspecified: F41.9

## 2018-12-15 LAB — CBC
HEMATOCRIT: 37.9 % (ref 36.0–46.0)
Hemoglobin: 12.9 g/dL (ref 12.0–15.0)
MCH: 31.9 pg (ref 26.0–34.0)
MCHC: 34 g/dL (ref 30.0–36.0)
MCV: 93.6 fL (ref 80.0–100.0)
Platelets: 157 10*3/uL (ref 150–400)
RBC: 4.05 MIL/uL (ref 3.87–5.11)
RDW: 12.5 % (ref 11.5–15.5)
WBC: 8.9 10*3/uL (ref 4.0–10.5)
nRBC: 0 % (ref 0.0–0.2)

## 2018-12-15 LAB — URINALYSIS, ROUTINE W REFLEX MICROSCOPIC
BILIRUBIN URINE: NEGATIVE
Glucose, UA: NEGATIVE mg/dL
HGB URINE DIPSTICK: NEGATIVE
KETONES UR: NEGATIVE mg/dL
Leukocytes,Ua: NEGATIVE
NITRITE: NEGATIVE
PH: 7 (ref 5.0–8.0)
Protein, ur: NEGATIVE mg/dL
Specific Gravity, Urine: 1.023 (ref 1.005–1.030)

## 2018-12-15 LAB — I-STAT TROPONIN, ED: TROPONIN I, POC: 0 ng/mL (ref 0.00–0.08)

## 2018-12-15 LAB — LIPASE, BLOOD: Lipase: 42 U/L (ref 11–51)

## 2018-12-15 LAB — COMPREHENSIVE METABOLIC PANEL
ALBUMIN: 4 g/dL (ref 3.5–5.0)
ALT: 13 U/L (ref 0–44)
AST: 15 U/L (ref 15–41)
Alkaline Phosphatase: 35 U/L — ABNORMAL LOW (ref 38–126)
Anion gap: 8 (ref 5–15)
BILIRUBIN TOTAL: 0.5 mg/dL (ref 0.3–1.2)
BUN: 15 mg/dL (ref 6–20)
CHLORIDE: 103 mmol/L (ref 98–111)
CO2: 27 mmol/L (ref 22–32)
CREATININE: 0.85 mg/dL (ref 0.44–1.00)
Calcium: 9.2 mg/dL (ref 8.9–10.3)
GFR calc Af Amer: >60 mL/min (ref >60)
GFR calc non Af Amer: >60 mL/min (ref >60)
GLUCOSE: 121 mg/dL — AB (ref 70–99)
Potassium: 3.6 mmol/L (ref 3.5–5.1)
Sodium: 138 mmol/L (ref 135–145)
TOTAL PROTEIN: 6.6 g/dL (ref 6.5–8.1)

## 2018-12-15 LAB — I-STAT BETA HCG BLOOD, ED (MC, WL, AP ONLY): I-stat hCG, quantitative: <5 m[IU]/mL (ref <5)

## 2018-12-15 SURGERY — LAPAROSCOPIC CHOLECYSTECTOMY WITH INTRAOPERATIVE CHOLANGIOGRAM
Anesthesia: General | Site: Abdomen

## 2018-12-15 MED ORDER — DIPHENHYDRAMINE HCL 50 MG/ML IJ SOLN: 12.5000 mg | Freq: Four times a day (QID) | INTRAMUSCULAR | Status: DC | PRN

## 2018-12-15 MED ORDER — FENTANYL CITRATE (PF) 250 MCG/5ML IJ SOLN
INTRAMUSCULAR | Status: AC
  Filled 2018-12-15: qty 5

## 2018-12-15 MED ORDER — ONDANSETRON HCL 4 MG/2ML IJ SOLN: 4.0000 mg | Freq: Once | INTRAMUSCULAR | Status: DC | PRN

## 2018-12-15 MED ORDER — PHENYLEPHRINE HCL 10 MG/ML IJ SOLN
INTRAMUSCULAR | Status: DC | PRN
  Administered 2018-12-15: 200 ug via INTRAVENOUS
  Administered 2018-12-15: 80 ug via INTRAVENOUS
  Administered 2018-12-15: 160 ug via INTRAVENOUS

## 2018-12-15 MED ORDER — MIDAZOLAM HCL 5 MG/5ML IJ SOLN
INTRAMUSCULAR | Status: DC | PRN
  Administered 2018-12-15: 2 mg via INTRAVENOUS

## 2018-12-15 MED ORDER — FENTANYL CITRATE (PF) 100 MCG/2ML IJ SOLN
INTRAMUSCULAR | Status: AC
  Filled 2018-12-15: qty 2

## 2018-12-15 MED ORDER — VASOPRESSIN 20 UNIT/ML IV SOLN
INTRAVENOUS | Status: AC
  Filled 2018-12-15: qty 1

## 2018-12-15 MED ORDER — HYDROMORPHONE HCL 1 MG/ML IJ SOLN: 0.5000 mg | INTRAMUSCULAR | Status: DC | PRN

## 2018-12-15 MED ORDER — SIMETHICONE 80 MG PO CHEW: 40.0000 mg | CHEWABLE_TABLET | Freq: Four times a day (QID) | ORAL | Status: DC | PRN

## 2018-12-15 MED ORDER — FENTANYL CITRATE (PF) 100 MCG/2ML IJ SOLN
INTRAMUSCULAR | Status: AC
  Administered 2018-12-15: 50 ug via INTRAVENOUS
  Filled 2018-12-15: qty 2

## 2018-12-15 MED ORDER — ACETAMINOPHEN 325 MG PO TABS
650.0000 mg | ORAL_TABLET | Freq: Four times a day (QID) | ORAL | Status: DC
  Administered 2018-12-15 – 2018-12-16 (×4): 650 mg via ORAL
  Filled 2018-12-15 (×4): qty 2

## 2018-12-15 MED ORDER — OXYCODONE HCL 5 MG PO TABS: 5.0000 mg | ORAL_TABLET | Freq: Once | ORAL | Status: DC | PRN

## 2018-12-15 MED ORDER — MIDAZOLAM HCL 2 MG/2ML IJ SOLN
INTRAMUSCULAR | Status: AC
  Filled 2018-12-15: qty 2

## 2018-12-15 MED ORDER — CIPROFLOXACIN IN D5W 400 MG/200ML IV SOLN
INTRAVENOUS | Status: AC
  Filled 2018-12-15: qty 200

## 2018-12-15 MED ORDER — BUPIVACAINE-EPINEPHRINE (PF) 0.25% -1:200000 IJ SOLN
30.0000 mL | Freq: Once | INTRAMUSCULAR | Status: AC
  Administered 2018-12-15: 18 mL
  Filled 2018-12-15: qty 30

## 2018-12-15 MED ORDER — DIPHENHYDRAMINE HCL 12.5 MG/5ML PO ELIX: 12.5000 mg | ORAL_SOLUTION | Freq: Four times a day (QID) | ORAL | Status: DC | PRN

## 2018-12-15 MED ORDER — MORPHINE SULFATE (PF) 4 MG/ML IV SOLN
6.0000 mg | INTRAVENOUS | Status: DC | PRN
  Administered 2018-12-15: 6 mg via INTRAVENOUS
  Filled 2018-12-15: qty 2

## 2018-12-15 MED ORDER — SODIUM CHLORIDE 0.9 % IR SOLN
Status: DC | PRN
  Administered 2018-12-15: 1000 mL

## 2018-12-15 MED ORDER — LACTATED RINGERS IV SOLN
INTRAVENOUS | Status: DC
  Administered 2018-12-15: 10:00:00 via INTRAVENOUS

## 2018-12-15 MED ORDER — ONDANSETRON 4 MG PO TBDP: 4.0000 mg | ORAL_TABLET | Freq: Four times a day (QID) | ORAL | Status: DC | PRN

## 2018-12-15 MED ORDER — ONDANSETRON HCL 4 MG/2ML IJ SOLN
INTRAMUSCULAR | Status: DC | PRN
  Administered 2018-12-15: 4 mg via INTRAVENOUS

## 2018-12-15 MED ORDER — LIDOCAINE 2% (20 MG/ML) 5 ML SYRINGE
INTRAMUSCULAR | Status: DC | PRN
  Administered 2018-12-15: 60 mg via INTRAVENOUS

## 2018-12-15 MED ORDER — EPHEDRINE SULFATE 50 MG/ML IJ SOLN
INTRAMUSCULAR | Status: DC | PRN
  Administered 2018-12-15: 25 mg via INTRAVENOUS
  Administered 2018-12-15: 15 mg via INTRAVENOUS

## 2018-12-15 MED ORDER — STERILE WATER FOR IRRIGATION IR SOLN
Status: DC | PRN
  Administered 2018-12-15: 1000 mL

## 2018-12-15 MED ORDER — SCOPOLAMINE 1 MG/3DAYS TD PT72
MEDICATED_PATCH | TRANSDERMAL | Status: AC
  Administered 2018-12-15: 09:00:00 via TRANSDERMAL
  Filled 2018-12-15: qty 1

## 2018-12-15 MED ORDER — ONDANSETRON HCL 4 MG/2ML IJ SOLN: 4.0000 mg | Freq: Four times a day (QID) | INTRAMUSCULAR | Status: DC | PRN

## 2018-12-15 MED ORDER — DEXAMETHASONE SODIUM PHOSPHATE 4 MG/ML IJ SOLN
INTRAMUSCULAR | Status: DC | PRN
  Administered 2018-12-15: 5 mg via INTRAVENOUS

## 2018-12-15 MED ORDER — SODIUM CHLORIDE 0.9% FLUSH: 3.0000 mL | Freq: Once | INTRAVENOUS | Status: DC

## 2018-12-15 MED ORDER — BUPROPION HCL ER (XL) 150 MG PO TB24
300.0000 mg | ORAL_TABLET | Freq: Every day | ORAL | Status: DC
  Administered 2018-12-16: 300 mg via ORAL
  Filled 2018-12-15: qty 2

## 2018-12-15 MED ORDER — 0.9 % SODIUM CHLORIDE (POUR BTL) OPTIME
TOPICAL | Status: DC | PRN
  Administered 2018-12-15: 1000 mL

## 2018-12-15 MED ORDER — FENTANYL CITRATE (PF) 100 MCG/2ML IJ SOLN
INTRAMUSCULAR | Status: DC | PRN
  Administered 2018-12-15: 100 ug via INTRAVENOUS

## 2018-12-15 MED ORDER — HEPARIN SODIUM (PORCINE) 5000 UNIT/ML IJ SOLN
5000.0000 [IU] | Freq: Three times a day (TID) | INTRAMUSCULAR | Status: DC
  Administered 2018-12-15: 5000 [IU] via SUBCUTANEOUS

## 2018-12-15 MED ORDER — DOCUSATE SODIUM 100 MG PO CAPS
200.0000 mg | ORAL_CAPSULE | Freq: Two times a day (BID) | ORAL | Status: DC
  Administered 2018-12-15 – 2018-12-16 (×3): 200 mg via ORAL
  Filled 2018-12-15 (×3): qty 2

## 2018-12-15 MED ORDER — PROPOFOL 10 MG/ML IV BOLUS
INTRAVENOUS | Status: DC | PRN
  Administered 2018-12-15: 150 mg via INTRAVENOUS

## 2018-12-15 MED ORDER — CIPROFLOXACIN IN D5W 400 MG/200ML IV SOLN
400.0000 mg | Freq: Two times a day (BID) | INTRAVENOUS | Status: DC
  Administered 2018-12-15: 400 mg via INTRAVENOUS

## 2018-12-15 MED ORDER — OXYCODONE HCL 5 MG/5ML PO SOLN: 5.0000 mg | Freq: Once | ORAL | Status: DC | PRN

## 2018-12-15 MED ORDER — FENTANYL CITRATE (PF) 100 MCG/2ML IJ SOLN
25.0000 ug | INTRAMUSCULAR | Status: DC | PRN
  Administered 2018-12-15 (×3): 50 ug via INTRAVENOUS

## 2018-12-15 MED ORDER — SUGAMMADEX SODIUM 200 MG/2ML IV SOLN
INTRAVENOUS | Status: DC | PRN
  Administered 2018-12-15: 200 mg via INTRAVENOUS

## 2018-12-15 MED ORDER — HEPARIN SODIUM (PORCINE) 5000 UNIT/ML IJ SOLN
INTRAMUSCULAR | Status: AC
  Administered 2018-12-15: 5000 [IU] via SUBCUTANEOUS
  Filled 2018-12-15: qty 1

## 2018-12-15 MED ORDER — SODIUM CHLORIDE 0.9 % IV SOLN
INTRAVENOUS | Status: DC | PRN
  Administered 2018-12-15: 09:00:00 via INTRAVENOUS

## 2018-12-15 MED ORDER — HYDROMORPHONE HCL 1 MG/ML IJ SOLN: 0.2500 mg | INTRAMUSCULAR | Status: DC | PRN

## 2018-12-15 MED ORDER — ROCURONIUM BROMIDE 100 MG/10ML IV SOLN
INTRAVENOUS | Status: DC | PRN
  Administered 2018-12-15: 50 mg via INTRAVENOUS

## 2018-12-15 MED ORDER — IBUPROFEN 600 MG PO TABS
600.0000 mg | ORAL_TABLET | Freq: Four times a day (QID) | ORAL | Status: DC | PRN
  Administered 2018-12-15 – 2018-12-16 (×2): 600 mg via ORAL
  Filled 2018-12-15 (×3): qty 1

## 2018-12-15 MED ORDER — TRAMADOL HCL 50 MG PO TABS: 50.0000 mg | ORAL_TABLET | Freq: Four times a day (QID) | ORAL | Status: DC | PRN

## 2018-12-15 MED ORDER — HYDRALAZINE HCL 20 MG/ML IJ SOLN: 10.0000 mg | INTRAMUSCULAR | Status: DC | PRN

## 2018-12-15 MED ORDER — KETOROLAC TROMETHAMINE 30 MG/ML IJ SOLN
INTRAMUSCULAR | Status: DC | PRN
  Administered 2018-12-15: 30 mg via INTRAVENOUS

## 2018-12-15 SURGICAL SUPPLY — 43 items
ADH SKN CLS APL DERMABOND .7 (GAUZE/BANDAGES/DRESSINGS) ×1
APPLIER CLIP 5 13 M/L LIGAMAX5 (MISCELLANEOUS) ×3
APR CLP MED LRG 5 ANG JAW (MISCELLANEOUS) ×1
BAG SPEC RTRVL LRG 6X4 10 (ENDOMECHANICALS)
BLADE CLIPPER SURG (BLADE) IMPLANT
CANISTER SUCT 3000ML PPV (MISCELLANEOUS) ×1 IMPLANT
CHLORAPREP W/TINT 26ML (MISCELLANEOUS) ×3 IMPLANT
CLIP APPLIE 5 13 M/L LIGAMAX5 (MISCELLANEOUS) ×1 IMPLANT
COVER MAYO STAND STRL (DRAPES) ×1 IMPLANT
COVER SURGICAL LIGHT HANDLE (MISCELLANEOUS) ×3 IMPLANT
COVER WAND RF STERILE (DRAPES) ×3 IMPLANT
DERMABOND ADVANCED (GAUZE/BANDAGES/DRESSINGS) ×2
DERMABOND ADVANCED .7 DNX12 (GAUZE/BANDAGES/DRESSINGS) ×1 IMPLANT
DISSECTOR BLUNT TIP ENDO 5MM (MISCELLANEOUS) IMPLANT
DRAPE C-ARM 42X72 X-RAY (DRAPES) ×1 IMPLANT
ELECT CAUTERY BLADE 6.4 (BLADE) ×3 IMPLANT
ELECT REM PT RETURN 9FT ADLT (ELECTROSURGICAL) ×3
ELECTRODE REM PT RTRN 9FT ADLT (ELECTROSURGICAL) ×1 IMPLANT
GLOVE BIOGEL M STRL SZ7.5 (GLOVE) ×3 IMPLANT
GLOVE INDICATOR 8.0 STRL GRN (GLOVE) ×3 IMPLANT
GOWN STRL REUS W/ TWL LRG LVL3 (GOWN DISPOSABLE) ×2 IMPLANT
GOWN STRL REUS W/ TWL XL LVL3 (GOWN DISPOSABLE) ×1 IMPLANT
GOWN STRL REUS W/TWL LRG LVL3 (GOWN DISPOSABLE) ×6
GOWN STRL REUS W/TWL XL LVL3 (GOWN DISPOSABLE) ×3
KIT BASIN OR (CUSTOM PROCEDURE TRAY) ×3 IMPLANT
KIT TURNOVER KIT B (KITS) ×3 IMPLANT
NS IRRIG 1000ML POUR BTL (IV SOLUTION) ×3 IMPLANT
PAD ARMBOARD 7.5X6 YLW CONV (MISCELLANEOUS) ×3 IMPLANT
PENCIL SMOKE EVACUATOR (MISCELLANEOUS) ×5 IMPLANT
POUCH SPECIMEN RETRIEVAL 10MM (ENDOMECHANICALS) IMPLANT
SCISSORS LAP 5X35 DISP (ENDOMECHANICALS) ×3 IMPLANT
SET CHOLANGIOGRAPH 5 50 .035 (SET/KITS/TRAYS/PACK) ×3 IMPLANT
SET IRRIG TUBING LAPAROSCOPIC (IRRIGATION / IRRIGATOR) ×3 IMPLANT
SET TUBE SMOKE EVAC HIGH FLOW (TUBING) ×3 IMPLANT
SLEEVE ENDOPATH XCEL 5M (ENDOMECHANICALS) ×6 IMPLANT
SPECIMEN JAR SMALL (MISCELLANEOUS) ×3 IMPLANT
SUT MNCRL AB 4-0 PS2 18 (SUTURE) ×3 IMPLANT
TOWEL OR 17X24 6PK STRL BLUE (TOWEL DISPOSABLE) ×3 IMPLANT
TOWEL OR 17X26 10 PK STRL BLUE (TOWEL DISPOSABLE) ×3 IMPLANT
TRAY LAPAROSCOPIC MC (CUSTOM PROCEDURE TRAY) ×3 IMPLANT
TROCAR XCEL BLUNT TIP 100MML (ENDOMECHANICALS) ×3 IMPLANT
TROCAR XCEL NON-BLD 5MMX100MML (ENDOMECHANICALS) ×3 IMPLANT
WATER STERILE IRR 1000ML POUR (IV SOLUTION) ×3 IMPLANT

## 2018-12-15 NOTE — Anesthesia Procedure Notes (Signed)
Procedure Name: Intubation Date/Time: 12/15/2018 9:20 AM Performed by: Tillman Abide, CRNA Pre-anesthesia Checklist: Patient identified, Emergency Drugs available, Suction available and Patient being monitored Patient Re-evaluated:Patient Re-evaluated prior to induction Oxygen Delivery Method: Circle System Utilized Preoxygenation: Pre-oxygenation with 100% oxygen Induction Type: IV induction Ventilation: Mask ventilation without difficulty Laryngoscope Size: Miller and 2 Grade View: Grade I Tube type: Oral Tube size: 7.0 mm Number of attempts: 1 Airway Equipment and Method: Stylet Placement Confirmation: ETT inserted through vocal cords under direct vision,  positive ETCO2 and breath sounds checked- equal and bilateral Secured at: 21 cm Tube secured with: Tape Dental Injury: Teeth and Oropharynx as per pre-operative assessment

## 2018-12-15 NOTE — ED Provider Notes (Signed)
[facility]   CSN: [id number] Arrival date & time: 12/15/18  0059     History   Chief Complaint Chief Complaint  Patient presents with  . Abdominal Pain    HPI Debbie Melendez is a 33 y.o. female.  HPI  33 year old female comes in a chief complaint of abdominal pain. Patient has history of ulcerative colitis, no other medical problems.  She reports that she has been having some diarrhea, nausea, vomiting x 1 day.  The pain initially in her abdomen was cramping in nature, however tonight she started having severe right upper quadrant abdominal pain that was radiating to flank region on both sides.  Patient's pain has improved since she came to the ER.  She has family history of cholecystectomy.  Past Medical History:  Diagnosis Date  . Anemia   . Anxiety   . Depression   . Strep throat   . Ulcerative colitis     Patient Active Problem List   Diagnosis Date Noted  . Status post primary low transverse cesarean section 05/28/2016  . Normal labor 05/26/2016  . RECTAL BLEEDING 10/06/2008  . B12 DEFICIENCY 03/11/2008  . ANXIETY 03/10/2008  . CONSTIPATION 03/10/2008  . ABDOMINAL PAIN, GENERALIZED 03/10/2008    Past Surgical History:  Procedure Laterality Date  . ADENOIDECTOMY    . CESAREAN SECTION N/A 05/26/2016   Procedure: CESAREAN SECTION;  Surgeon: Myna HidalgoJennifer Ozan, DO;  Location: [facility];  Service: Obstetrics;  Laterality: N/A;  . TONSILLECTOMY       OB History    Gravida  1   Para  1   Term  1   Preterm      AB      Living  1     SAB      TAB      Ectopic      Multiple  0   Live Births  1            Home Medications    Prior to Admission medications   Medication Sig Start Date End Date Taking? Authorizing Provider  acetaminophen (TYLENOL) 325 MG tablet Take 2 tablets (650 mg total) by mouth every 4 (four) hours as needed (for pain scale < 4). Patient taking differently: Take  650 mg by mouth every 4 (four) hours as needed for mild pain (for pain scale < 4).  05/29/16  Yes Gerald Leitzole, Tara, MD  buPROPion (WELLBUTRIN XL) 300 MG 24 hr tablet Take 300 mg by mouth daily.   Yes [provider]  docusate sodium (COLACE) 100 MG capsule Take 100 mg by mouth daily.   Yes [provider]  fluticasone (FLONASE) 50 MCG/ACT nasal spray Place 1 spray into both nostrils daily as needed for allergies or rhinitis.   Yes [provider]  HYDROmorphone (DILAUDID) 2 MG tablet 1-2 po every 6 hours as needed for severe pain 05/29/16  Yes Gerald Leitzole, Tara, MD  ibuprofen (ADVIL,MOTRIN) 200 MG tablet Take 600-800 mg by mouth every 6 (six) hours as needed for moderate pain.   Yes [provider]  valACYclovir (VALTREX) 500 MG tablet Take 500 mg by mouth 2 (two) times daily as needed (outbreak).  05/04/16  Yes [provider]  vitamin B-12 (CYANOCOBALAMIN) 1000 MCG tablet Take 2,500 mcg by mouth daily.   Yes [provider]    Family History Family History  Problem Relation Age of Onset  . Cancer Other   . Hypertension Other   .  Thyroid disease Other   . Heart failure Other     Social History Social History   Tobacco Use  . Smoking status: Never Smoker  . Smokeless tobacco: Never Used  Substance Use Topics  . Alcohol use: Yes  . Drug use: No     Allergies   Amoxicillin; Ampicillin; Erythromycin; and Penicillins   Review of Systems Review of Systems  Constitutional: Positive for activity change.  Respiratory: Negative for shortness of breath.   Cardiovascular: Negative for chest pain.  Gastrointestinal: Positive for abdominal pain and diarrhea. Negative for nausea and vomiting.  Genitourinary: Negative for dysuria.  Musculoskeletal: Negative for neck pain.  Neurological: Negative for headaches.  All other systems reviewed and are negative.    Physical Exam Updated Vital Signs BP 119/80   Pulse 91   Temp 97.8 F (36.6 C) (Oral)    Resp 16   LMP 12/13/2018   SpO2 100%   Physical Exam Constitutional:      Appearance: She is well-developed.  HENT:     Head: Normocephalic and atraumatic.  Eyes:     Pupils: Pupils are equal, round, and reactive to light.  Neck:     Musculoskeletal: Neck supple.  Cardiovascular:     Rate and Rhythm: Normal rate and regular rhythm.     Heart sounds: Normal heart sounds. No murmur.  Pulmonary:     Effort: Pulmonary effort is normal. No respiratory distress.  Abdominal:     General: There is no distension.     Palpations: Abdomen is soft.     Tenderness: There is abdominal tenderness in the right upper quadrant. There is guarding. There is no rebound. Positive signs include Murphy's sign.  Skin:    General: Skin is warm and dry.  Neurological:     Mental Status: She is alert and oriented to person, place, and time.      ED Treatments / Results  Labs (all labs ordered are listed, but only abnormal results are displayed) Labs Reviewed  COMPREHENSIVE METABOLIC PANEL - Abnormal; Notable for the following components:      Result Value   Glucose, Bld 121 (*)    Alkaline Phosphatase 35 (*)    All other components within normal limits  URINALYSIS, ROUTINE W REFLEX MICROSCOPIC - Abnormal; Notable for the following components:   APPearance HAZY (*)    All other components within normal limits  LIPASE, BLOOD  CBC  I-STAT BETA HCG BLOOD, ED ([facility], [facility], [facility] ONLY)  I-STAT TROPONIN, ED    EKG EKG Interpretation  Date/Time:  Sunday December 15 2018 01:14:14 EST Ventricular Rate:  78 PR Interval:  152 QRS Duration: 80 QT Interval:  410 QTC Calculation: 467 R Axis:   91 Text Interpretation:  Normal sinus rhythm Rightward axis Borderline ECG No acute changes No significant change since last tracing Confirmed by Derwood Kaplan [phone]) on 12/15/2018 2:49:20 AM   Radiology US Abdomen Limited  Result Date: 12/15/2018 CLINICAL DATA:  Right upper quadrant pain EXAM: ULTRASOUND  ABDOMEN LIMITED RIGHT UPPER QUADRANT COMPARISON:  None. FINDINGS: Gallbladder: Within the gallbladder, there are multiple echogenic foci which move and shadow consistent with cholelithiasis. Largest gallstone measures 2 mm in length. The gallbladder wall is upper normal in thickness with subtle gallbladder wall edema. There is no appreciable pericholecystic fluid. No sonographic Murphy sign noted by sonographer. Common bile duct: Diameter: 5 mm with tapering to 3 mm distally. No intrahepatic or extrahepatic biliary duct dilatation. Liver: No focal lesion identified. Within normal  limits in parenchymal echogenicity. Portal vein is patent on color Doppler imaging with normal direction of blood flow towards the liver. IMPRESSION: Cholelithiasis. Upper normal gallbladder wall thickness with subtle gallbladder wall edema. Suspect a degree of early acute cholecystitis. This finding may warrant nuclear medicine hepatobiliary imaging study to assess for cystic duct patency. Study otherwise unremarkable. Electronically Signed   By: Bretta Bang III M.D.   On: 12/15/2018 04:52    Procedures Procedures (including critical care time)  Medications Ordered in ED Medications  sodium chloride flush (NS) 0.9 % injection 3 mL (3 mLs Intravenous Not Given 12/15/18 0239)  morphine 4 MG/ML injection 6 mg (6 mg Intravenous Given 12/15/18 0400)     Initial Impression / Assessment and Plan / ED Course  I have reviewed the triage vital signs and the nursing notes.  Pertinent labs & imaging results that were available during my care of the patient were reviewed by me and considered in my medical decision making (see chart for details).     Patient comes in with chief complaint of abdominal pain.  She started having some diarrhea and generalized abdominal pain yesterday during the daytime, but at nighttime she started with severe right upper quadrant pain.  On my exam she has Murphy's and there is some family history of  cholecystitis, therefore I am concerned that she might be having cholelithiasis or cholecystitis.  Right upper quadrant ultrasound ordered and it does show that patient is having cholelithiasis with some gallbladder wall thickening and clinically I am still thinking she has acute cholecystitis.  Surgery has been called and they will admit.  Patient does not have elevated white count, will not start any antibiotics.  Final Clinical Impressions(s) / ED Diagnoses   Final diagnoses:  Cholecystitis    ED Discharge Orders    None       Derwood Kaplan, MD 12/15/18 [phone]

## 2018-12-15 NOTE — Progress Notes (Signed)
Report received from [facility], [place] ED. Pre-op care not initiated, but consent will be signed.

## 2018-12-15 NOTE — ED Triage Notes (Signed)
C/o "gnawing ache" to mid upper abd that radiates through to back since Saturday morning with diarrhea.  Denies nausea/vomiting.

## 2018-12-15 NOTE — Op Note (Signed)
12/15/2018 10:28 AM  PATIENT: Debbie Melendez  33 y.o. female  Patient Care Team: Laurann Montana, MD as PCP - General (Family Medicine)  PRE-OPERATIVE DIAGNOSIS: Acute cholecystitis  POST-OPERATIVE DIAGNOSIS: Same  PROCEDURE: Laparoscopic cholecystectomy  SURGEON: Stephanie Coup. Alisyn Lequire, MD  ASSISTANT: OR Staff  ANESTHESIA: General endotracheal  EBL: Total I/O In: 1000 [I.V.:1000] Out: -   DRAINS: None  SPECIMEN: Gallbladder  EBL: 10 mL  COUNTS: Sponge, needle and instrument counts were reported correct x2 at the conclusion of the operation  DISPOSITION: PACU in satisfactory condition  COMPLICATIONS: None  FINDINGS: Acutely inflamed gallbladder.  Somewhat edematous.  Omentum was adherent to the inferior wall of the gallbladder.  The critical view of safety was obtained prior to clipping or dividing any structures.  She did have a very small anterior cystic and a more pronounced posterior cystic.  INDICATION: Debbie Melendez is an 33 y.o. female with hx of anxiety and depression, IBS presents to the ED with 3d hx of abdominal discomfort. Beginning Thursday she was having cramps and diarrhea, this turned into MEG and RUQ pain on Saturday. Associated chills and nausea. Pain radiates to her back. Describes pain as "gnawing" and constant. Nothing seems to make it better or worse. She denies ever having had this before. She does report her brother and father had the same symptoms and were found to have issues with their gallbladder.  She was worked up in the emergency department.  Her WBC was found to be normal.  LFTs were normal.  Lipase was normal.  She underwent right upper quadrant ultrasound which demonstrated cholelithiasis.  Gallbladder wall thickness at the upper limit of normal with some gallbladder wall edema.  Favored to have early acute cholecystitis by ultrasound.  Common bile duct measured 5 mm.  Sonographic Murphy's was negative.  Largest gallstone was noted to be 2 mm on  the ultrasound.  Options were discussed with her moving forward and given her ongoing issues with persistent right upper quadrant pain, nausea, and p.o. intolerance, she opted undergo surgical intervention.  We discussed cholecystectomy.  Please refer to notes elsewhere for details regarding this discussion.  DESCRIPTION:   The patient was identified & brought into the operating room. She was then positioned supine on the OR table. SCDs were in place and active during the entire case. She then underwent general endotracheal anesthesia. Pressure points were padded. The abdomen was prepped and draped in the standard sterile fashion. Antibiotics were administered. A surgical timeout was performed and confirmed our plan.   An infraumbilical skin incision was made. The umbilical stalk was grasped and retracted outwardly. The infraumbilical fascia was identified and incised. The peritoneal cavity was gently entered bluntly. A purse-string 0 Vicryl suture was placed. The Hasson cannula was inserted into the peritoneal cavity and insufflation with CO2 commenced to . A laparoscope was inserted into the peritoneal cavity and inspection confirmed no evidence of trocar site complications. The patient was then positioned in reverse Trendelenburg with slight left side down. 3 additional 45mm trocars were placed along the right subcostal line - one 11mm port in mid subcostal region, another 47mm port in the right flank near the anterior axillary line, and a third 7mm port in the left subxiphoid region obliquely near the falciform ligament.  The liver and gallbladder were inspected.  Omentum was adherent to the gallbladder. The gallbladder fundus was grasped and elevated cephalad.  Omental adhesions were then carefully taken down sharply.  An additional grasper was then  placed on the infundibulum of the gallbladder and the infundibulum was retracted laterally. Staying high on the gallbladder, the peritoneum on both  sides of the gallbladder was opened with hook cautery. Gentle blunt dissection was then employed with a Art gallery manager working down into [employer]. The cystic duct was identified and carefully circumferentially dissected.  Anterior to the cystic duct was a small diminutive anterior cystic artery.  This was circumferentially dissected.  To facilitate dissection along the cystic duct this was doubly clipped proximally and singly clipped distally and subsequently divided.  The posterior cystic artery was also identified and carefully circumferentially dissected - this appeared to be dominant circulation to her gallbladder. The space between the cystic artery and hepatocystic plate was developed such that a good view of the liver could be seen through a window medial to the cystic artery. The triangle of Calot had been cleared of all fibrofatty tissue. At this point, a critical view of safety was achieved and the only structures visualized was the skeletonized cystic duct laterally, the skeletonized cystic artery and the liver through the window medial to the artery.  Her cystic duct was quite small and short.  Her LFTs were normal preoperatively and there was no evidence of choledocholithiasis on her ultrasound.  Given all these findings, a cholangiogram was not performed.  The cystic duct and artery were clipped with 2 clips on the patient side and 1 clip on the specimen side. The cystic duct and artery were then divided. The gallbladder was then freed from its remaining attachments to the liver using electrocautery and placed into an endocatch bag.  High up on the gallbladder towards the end of the dissection off of the liver plate, a small opening was made in the gallbladder.  There was some spillage of bile and this was evacuated with the suction irrigator.  There were no stones that were seen to have spilled. The RUQ was gently irrigated with sterile saline. Hemostasis was then verified. The clips  were in good position; the gallbladder fossa was dry. The rest of the abdomen was inspected no injury nor bleeding elsewhere was identified.  The gallbladder fossa as well as right upper quadrant were inspected for any evidence of spilled gallstones and none were identified.  The endocatch bag containing the gallbladder was then removed from the umbilical port site and passed off as specimen. The umbilical fascia was then closed using the 0 Vicryl purse-string suture. The fascia was palpated and noted to be completely closed. The RUQ ports were removed under direct visualization and noted to be hemostatic. The skin of all incision sites was approximated with 4-0 monocryl subcuticular suture and dermabond applied. She was then awakened from anesthesia, extubated, and transferred to a stretcher for transport to PACU in satisfactory condition.

## 2018-12-15 NOTE — ED Notes (Signed)
Patient transported to Ultrasound 

## 2018-12-15 NOTE — ED Notes (Signed)
istat trop ran earlier in error,  I will have POC credit pt's account.

## 2018-12-15 NOTE — Anesthesia Preprocedure Evaluation (Signed)
Anesthesia Evaluation  Patient identified by MRN, date of birth, ID band Patient awake    Reviewed: Allergy & Precautions, NPO status , Patient's Chart, lab work & pertinent test results  Airway Mallampati: II  TM Distance: >3 FB Neck ROM: Full    Dental  (+) Teeth Intact, Dental Advisory Given   Pulmonary    breath sounds clear to auscultation       Cardiovascular  Rhythm:Regular Rate:Normal     Neuro/Psych    GI/Hepatic   Endo/Other    Renal/GU      Musculoskeletal   Abdominal   Peds  Hematology   Anesthesia Other Findings   Reproductive/Obstetrics                             Anesthesia Physical Anesthesia Plan  ASA: I  Anesthesia Plan: General   Post-op Pain Management:    Induction: Intravenous  PONV Risk Score and Plan: Ondansetron and Dexamethasone  Airway Management Planned: Oral ETT  Additional Equipment:   Intra-op Plan:   Post-operative Plan: Extubation in OR  Informed Consent: I have reviewed the patients History and Physical, chart, labs and discussed the procedure including the risks, benefits and alternatives for the proposed anesthesia with the patient or authorized representative who has indicated his/her understanding and acceptance.     Dental advisory given  Plan Discussed with: CRNA and Anesthesiologist  Anesthesia Plan Comments:         Anesthesia Quick Evaluation  

## 2018-12-15 NOTE — Anesthesia Postprocedure Evaluation (Signed)
Anesthesia Post Note  Patient: Norris L Magnussen  Procedure(s) Performed: LAPAROSCOPIC CHOLECYSTECTOMY (N/A Abdomen)     Patient location during evaluation: PACU Anesthesia Type: General Level of consciousness: awake and alert Pain management: pain level controlled Vital Signs Assessment: post-procedure vital signs reviewed and stable Respiratory status: spontaneous breathing, nonlabored ventilation, respiratory function stable and patient connected to nasal cannula oxygen Cardiovascular status: blood pressure returned to baseline and stable Postop Assessment: no apparent nausea or vomiting Anesthetic complications: no    Last Vitals:  Vitals:   12/15/18 1345 12/15/18 1400  BP: 107/69 110/68  Pulse: 66 70  Resp: 14 17  Temp:  36.6 C  SpO2: 98% 98%    Last Pain:  Vitals:   12/15/18 1300  TempSrc:   PainSc: Asleep                 Nicolai Labonte COKER

## 2018-12-15 NOTE — H&P (Addendum)
CC: Abdominal pain, consult for possible cholecystitis by ED-P  HPI: Lashona L Brinley is an 33 y.o. female with hx of anxiety and depression, IBS presents to the ED with 3d hx of abdominal discomfort. Beginning Thursday she was having cramps and diarrhea, this turned into MEG and RUQ pain on Saturday. Associated chills and nausea. Pain radiates to her back. Describes pain as "gnawing" and constant. Nothing seems to make it better or worse. She denies ever having had this before. She does report her brother and father had the same symptoms and were found to have issues with their gallbladder.  PSH: she reports her only abdominal surgery being c-sx via pfannenstiel  Past Medical History:  Diagnosis Date  . Anemia   . Anxiety   . Depression   . Strep throat   . Ulcerative colitis     Past Surgical History:  Procedure Laterality Date  . ADENOIDECTOMY    . CESAREAN SECTION N/A 05/26/2016   Procedure: CESAREAN SECTION;  Surgeon: Myna Hidalgo, DO;  Location: [facility];  Service: Obstetrics;  Laterality: N/A;  . TONSILLECTOMY      Family History  Problem Relation Age of Onset  . Cancer Other   . Hypertension Other   . Thyroid disease Other   . Heart failure Other     Social:  reports that she has never smoked. She has never used smokeless tobacco. She reports current alcohol use. She reports that she does not use drugs.  Allergies:  Allergies  Allergen Reactions  . Amoxicillin Rash    No swelling or SOB per pt report  . Ampicillin Rash    No swelling or SOB per pt report  . Erythromycin Rash  . Penicillins Rash    Has patient had a PCN reaction causing immediate rash, facial/tongue/throat swelling, SOB or lightheadedness with hypotension: no Has patient had a PCN reaction causing severe rash involving mucus membranes or skin necrosis: no Has patient had a PCN reaction that required hospitalization no Has patient had a PCN reaction occurring within the last 10 years:  no If all of the above answers are "NO", then may proceed with Cephalosporin use.     Medications: I have reviewed the patient's current medications.  Results for orders placed or performed during the hospital encounter of 12/15/18 (from the past 48 hour(s))  Urinalysis, Routine w reflex microscopic     Status: Abnormal   Collection Time: 12/15/18  1:13 AM  Result Value Ref Range   Color, Urine YELLOW YELLOW   APPearance HAZY (A) CLEAR   Specific Gravity, Urine 1.023 1.005 - 1.030   pH 7.0 5.0 - 8.0   Glucose, UA NEGATIVE NEGATIVE mg/dL   Hgb urine dipstick NEGATIVE NEGATIVE   Bilirubin Urine NEGATIVE NEGATIVE   Ketones, ur NEGATIVE NEGATIVE mg/dL   Protein, ur NEGATIVE NEGATIVE mg/dL   Nitrite NEGATIVE NEGATIVE   Leukocytes,Ua NEGATIVE NEGATIVE    Comment: Performed at [facility], [address] [place], [place] [address]  Lipase, blood     Status: None   Collection Time: 12/15/18  1:25 AM  Result Value Ref Range   Lipase 42 11 - 51 U/L    Comment: Performed at [facility], [address] [place], [place] [address]  Comprehensive metabolic panel     Status: Abnormal   Collection Time: 12/15/18  1:25 AM  Result Value Ref Range   Sodium 138 135 - 145 mmol/L   Potassium 3.6 3.5 - 5.1  mmol/L   Chloride 103 98 - 111 mmol/L   CO2 27 22 - 32 mmol/L   Glucose, Bld 121 (H) 70 - 99 mg/dL   BUN 15 6 - 20 mg/dL   Creatinine, Ser 6.73 0.44 - 1.00 mg/dL   Calcium 9.2 8.9 - 41.9 mg/dL   Total Protein 6.6 6.5 - 8.1 g/dL   Albumin 4.0 3.5 - 5.0 g/dL   AST 15 15 - 41 U/L   ALT 13 0 - 44 U/L   Alkaline Phosphatase 35 (L) 38 - 126 U/L   Total Bilirubin 0.5 0.3 - 1.2 mg/dL   GFR calc non Af Amer >60 >60 mL/min   GFR calc Af Amer >60 >60 mL/min   Anion gap 8 5 - 15    Comment: Performed at [facility], [address], [place], [place] [address]  CBC     Status: None   Collection Time: 12/15/18  1:25 AM  Result Value Ref Range   WBC 8.9 4.0 - 10.5 K/uL   RBC  4.05 3.87 - 5.11 MIL/uL   Hemoglobin 12.9 12.0 - 15.0 g/dL   HCT 40.9 73.5 - 32.9 %   MCV 93.6 80.0 - 100.0 fL   MCH 31.9 26.0 - 34.0 pg   MCHC 34.0 30.0 - 36.0 g/dL   RDW 92.4 26.8 - 34.1 %   Platelets 157 150 - 400 K/uL   nRBC 0.0 0.0 - 0.2 %    Comment: Performed at [facility], [address], [place], [place] [address]  I-stat troponin, ED     Status: None   Collection Time: 12/15/18  1:32 AM  Result Value Ref Range   Troponin i, poc 0.00 0.00 - 0.08 ng/mL   Comment 3            Comment: Due to the release kinetics of cTnI, a negative result within the first hours of the onset of symptoms does not rule out myocardial infarction with certainty. If myocardial infarction is still suspected, repeat the test at appropriate intervals.   I-Stat beta hCG blood, ED     Status: None   Collection Time: 12/15/18  1:48 AM  Result Value Ref Range   I-stat hCG, quantitative <5.0 <5 mIU/mL   Comment 3            Comment:   GEST. AGE      CONC.  (mIU/mL)   <=1 WEEK        5 - 50     2 WEEKS       50 - 500     3 WEEKS       100 - 10,000     4 WEEKS     1,000 - 30,000        FEMALE AND NON-PREGNANT FEMALE:     LESS THAN 5 mIU/mL     US Abdomen Limited  Result Date: 12/15/2018 CLINICAL DATA:  Right upper quadrant pain EXAM: ULTRASOUND ABDOMEN LIMITED RIGHT UPPER QUADRANT COMPARISON:  None. FINDINGS: Gallbladder: Within the gallbladder, there are multiple echogenic foci which move and shadow consistent with cholelithiasis. Largest gallstone measures 2 mm in length. The gallbladder wall is upper normal in thickness with subtle gallbladder wall edema. There is no appreciable pericholecystic fluid. No sonographic Murphy sign noted by sonographer. Common bile duct: Diameter: 5 mm with tapering to 3 mm distally. No intrahepatic or extrahepatic biliary duct dilatation. Liver: No focal lesion identified. Within normal limits in parenchymal  echogenicity. Portal vein is patent on color Doppler  imaging with normal direction of blood flow towards the liver. IMPRESSION: Cholelithiasis. Upper normal gallbladder wall thickness with subtle gallbladder wall edema. Suspect a degree of early acute cholecystitis. This finding may warrant nuclear medicine hepatobiliary imaging study to assess for cystic duct patency. Study otherwise unremarkable. Electronically Signed   By: Bretta Bang III M.D.   On: 12/15/2018 04:52    ROS - all of the below systems have been reviewed with the patient and positives are indicated with bold text General: chills, fever or night sweats Eyes: blurry vision or double vision ENT: epistaxis or sore throat Allergy/Immunology: itchy/watery eyes or nasal congestion Hematologic/Lymphatic: bleeding problems, blood clots or swollen lymph nodes Endocrine: temperature intolerance or unexpected weight changes Breast: new or changing breast lumps or nipple discharge Resp: cough, shortness of breath, or wheezing CV: chest pain or dyspnea on exertion GI: as per HPI GU: dysuria, trouble voiding, or hematuria MSK: joint pain or joint stiffness Neuro: TIA or stroke symptoms Derm: pruritus and skin lesion changes Psych: anxiety and depression  PE Blood pressure 115/78, pulse 84, temperature 97.8 F (36.6 C), temperature source Oral, resp. rate 16, last menstrual period 12/13/2018, SpO2 99 %, unknown if currently breastfeeding. Constitutional: NAD; conversant; no deformities Eyes: Moist conjunctiva; no lid lag; anicteric; PERRL Neck: Trachea midline; no thyromegaly Lungs: Normal respiratory effort; no tactile fremitus CV: RRR; no palpable thrills; no pitting edema GI: Abd soft, focally ttp in RUQ; negative Murphy's; no tenderness elsewhere; nondistended; no palpable hepatosplenomegaly MSK: Normal gait; no clubbing/cyanosis Psychiatric: Appropriate affect; alert and oriented x3 Lymphatic: No palpable cervical or axillary lymphadenopathy  Results for orders placed or  performed during the hospital encounter of 12/15/18 (from the past 48 hour(s))  Urinalysis, Routine w reflex microscopic     Status: Abnormal   Collection Time: 12/15/18  1:13 AM  Result Value Ref Range   Color, Urine YELLOW YELLOW   APPearance HAZY (A) CLEAR   Specific Gravity, Urine 1.023 1.005 - 1.030   pH 7.0 5.0 - 8.0   Glucose, UA NEGATIVE NEGATIVE mg/dL   Hgb urine dipstick NEGATIVE NEGATIVE   Bilirubin Urine NEGATIVE NEGATIVE   Ketones, ur NEGATIVE NEGATIVE mg/dL   Protein, ur NEGATIVE NEGATIVE mg/dL   Nitrite NEGATIVE NEGATIVE   Leukocytes,Ua NEGATIVE NEGATIVE    Comment: Performed at [facility], [address], [place], [place] [address]  Lipase, blood     Status: None   Collection Time: 12/15/18  1:25 AM  Result Value Ref Range   Lipase 42 11 - 51 U/L    Comment: Performed at [facility], [address] [place], [place] [address]  Comprehensive metabolic panel     Status: Abnormal   Collection Time: 12/15/18  1:25 AM  Result Value Ref Range   Sodium 138 135 - 145 mmol/L   Potassium 3.6 3.5 - 5.1 mmol/L   Chloride 103 98 - 111 mmol/L   CO2 27 22 - 32 mmol/L   Glucose, Bld 121 (H) 70 - 99 mg/dL   BUN 15 6 - 20 mg/dL   Creatinine, Ser 8.25 0.44 - 1.00 mg/dL   Calcium 9.2 8.9 - 18.9 mg/dL   Total Protein 6.6 6.5 - 8.1 g/dL   Albumin 4.0 3.5 - 5.0 g/dL   AST 15 15 - 41 U/L   ALT 13 0 - 44 U/L   Alkaline Phosphatase 35 (L) 38 - 126 U/L   Total  Bilirubin 0.5 0.3 - 1.2 mg/dL   GFR calc non Af Amer >60 >60 mL/min   GFR calc Af Amer >60 >60 mL/min   Anion gap 8 5 - 15    Comment: Performed at [facility], [address], [place], [place] [address]  CBC     Status: None   Collection Time: 12/15/18  1:25 AM  Result Value Ref Range   WBC 8.9 4.0 - 10.5 K/uL   RBC 4.05 3.87 - 5.11 MIL/uL   Hemoglobin 12.9 12.0 - 15.0 g/dL   HCT 60.4 54.0 - 98.1 %   MCV 93.6 80.0 - 100.0 fL   MCH 31.9 26.0 - 34.0 pg   MCHC 34.0 30.0 - 36.0 g/dL   RDW 19.1 47.8 -  29.5 %   Platelets 157 150 - 400 K/uL   nRBC 0.0 0.0 - 0.2 %    Comment: Performed at [facility], [address] [place], [place] [address]  I-stat troponin, ED     Status: None   Collection Time: 12/15/18  1:32 AM  Result Value Ref Range   Troponin i, poc 0.00 0.00 - 0.08 ng/mL   Comment 3            Comment: Due to the release kinetics of cTnI, a negative result within the first hours of the onset of symptoms does not rule out myocardial infarction with certainty. If myocardial infarction is still suspected, repeat the test at appropriate intervals.   I-Stat beta hCG blood, ED     Status: None   Collection Time: 12/15/18  1:48 AM  Result Value Ref Range   I-stat hCG, quantitative <5.0 <5 mIU/mL   Comment 3            Comment:   GEST. AGE      CONC.  (mIU/mL)   <=1 WEEK        5 - 50     2 WEEKS       50 - 500     3 WEEKS       100 - 10,000     4 WEEKS     1,000 - 30,000        FEMALE AND NON-PREGNANT FEMALE:     LESS THAN 5 mIU/mL     US Abdomen Limited  Result Date: 12/15/2018 CLINICAL DATA:  Right upper quadrant pain EXAM: ULTRASOUND ABDOMEN LIMITED RIGHT UPPER QUADRANT COMPARISON:  None. FINDINGS: Gallbladder: Within the gallbladder, there are multiple echogenic foci which move and shadow consistent with cholelithiasis. Largest gallstone measures 2 mm in length. The gallbladder wall is upper normal in thickness with subtle gallbladder wall edema. There is no appreciable pericholecystic fluid. No sonographic Murphy sign noted by sonographer. Common bile duct: Diameter: 5 mm with tapering to 3 mm distally. No intrahepatic or extrahepatic biliary duct dilatation. Liver: No focal lesion identified. Within normal limits in parenchymal echogenicity. Portal vein is patent on color Doppler imaging with normal direction of blood flow towards the liver. IMPRESSION: Cholelithiasis. Upper normal gallbladder wall thickness with subtle gallbladder wall edema. Suspect a degree of early  acute cholecystitis. This finding may warrant nuclear medicine hepatobiliary imaging study to assess for cystic duct patency. Study otherwise unremarkable. Electronically Signed   By: Bretta Bang III M.D.   On: 12/15/2018 04:52   LFTs and lipase normal  A/P: Edilia L Ang is an 33 y.o. female with acute cholecystitis  -The anatomy and physiology of the hepatobiliary  system was discussed at length with the patient. The pathophysiology of gallbladder disease was discussed at length with her as well. -The options for treatment were discussed including ongoing observation which may result in subsequent gallbladder complications (worsening infection, pancreatitis, choledocholithiasis, etc). -We discussed cholecystectomy - both laparoscopic and potential open techniques -The procedure, material risks (including, but not limited to, pain, bleeding, infection, scarring, need for blood transfusion, damage to surrounding structures- blood vessels/nerves/viscus/organs, damage to bile duct, bile leak, need for additional procedures, hernia, DVT/PE, worsening of pre-existing medical conditions, pancreatitis, pneumonia, heart attack, stroke, death) benefits and alternatives to surgery were discussed at length. I noted a good probability that the procedure would help improve her symptoms. The patient's questions were answered to her satisfaction, she voiced understanding and they elected to proceed with surgery. Additionally, we discussed typical postoperative expectations and the recovery process.  Stephanie Couphristopher M. Cliffton AstersWhite, M.D. [facility]

## 2018-12-15 NOTE — Transfer of Care (Signed)
Immediate Anesthesia Transfer of Care Note  Patient: Debbie Melendez  Procedure(s) Performed: LAPAROSCOPIC CHOLECYSTECTOMY (N/A Abdomen)  Patient Location: PACU  Anesthesia Type:General  Level of Consciousness: drowsy, patient cooperative and responds to stimulation  Airway & Oxygen Therapy: Patient Spontanous Breathing  Post-op Assessment: Report given to RN and Post -op Vital signs reviewed and stable  Post vital signs: Reviewed and stable  Last Vitals:  Vitals Value Taken Time  BP 128/82 12/15/2018 10:44 AM  Temp    Pulse 81 12/15/2018 10:47 AM  Resp 19 12/15/2018 10:47 AM  SpO2 100 % 12/15/2018 10:47 AM  Vitals shown include unvalidated device data.  Last Pain:  Vitals:   12/15/18 0712  TempSrc:   PainSc: 0-No pain         Complications: No apparent anesthesia complications

## 2018-12-16 ENCOUNTER — Encounter (HOSPITAL_COMMUNITY): Payer: Self-pay | Admitting: Surgery

## 2018-12-16 LAB — CBC WITH DIFFERENTIAL/PLATELET
Abs Immature Granulocytes: 0.04 10*3/uL (ref 0.00–0.07)
Basophils Absolute: 0 10*3/uL (ref 0.0–0.1)
Basophils Relative: 0 %
Eosinophils Absolute: 0 10*3/uL (ref 0.0–0.5)
Eosinophils Relative: 0 %
HCT: 34 % — ABNORMAL LOW (ref 36.0–46.0)
Hemoglobin: 11 g/dL — ABNORMAL LOW (ref 12.0–15.0)
Immature Granulocytes: 0 %
Lymphocytes Relative: 25 %
Lymphs Abs: 2.2 10*3/uL (ref 0.7–4.0)
MCH: 31.2 pg (ref 26.0–34.0)
MCHC: 32.4 g/dL (ref 30.0–36.0)
MCV: 96.3 fL (ref 80.0–100.0)
Monocytes Absolute: 0.6 10*3/uL (ref 0.1–1.0)
Monocytes Relative: 6 %
Neutro Abs: 6 10*3/uL (ref 1.7–7.7)
Neutrophils Relative %: 69 %
Platelets: 137 10*3/uL — ABNORMAL LOW (ref 150–400)
RBC: 3.53 MIL/uL — ABNORMAL LOW (ref 3.87–5.11)
RDW: 12.7 % (ref 11.5–15.5)
WBC: 8.9 10*3/uL (ref 4.0–10.5)
nRBC: 0 % (ref 0.0–0.2)

## 2018-12-16 LAB — HIV ANTIBODY (ROUTINE TESTING W REFLEX): HIV Screen 4th Generation wRfx: NONREACTIVE

## 2018-12-16 MED ORDER — OXYCODONE HCL 5 MG PO TABS: 5.0000 mg | ORAL_TABLET | Freq: Four times a day (QID) | ORAL | Status: DC | PRN

## 2018-12-16 MED ORDER — OXYCODONE HCL 5 MG PO TABS: 5.0000 mg | ORAL_TABLET | ORAL | Status: DC | PRN

## 2018-12-16 MED ORDER — ONDANSETRON 4 MG PO TBDP: 4.0000 mg | ORAL_TABLET | Freq: Four times a day (QID) | ORAL | 0 refills | Status: DC | PRN

## 2018-12-16 MED ORDER — POLYETHYLENE GLYCOL 3350 17 G PO PACK: 17.0000 g | PACK | Freq: Every day | ORAL | 0 refills | Status: DC | PRN

## 2018-12-16 MED ORDER — OXYCODONE HCL 5 MG PO TABS: 5.0000 mg | ORAL_TABLET | Freq: Four times a day (QID) | ORAL | 0 refills | Status: DC | PRN

## 2018-12-16 NOTE — Discharge Instructions (Signed)
[facility]  Please arrive at least 30 min before your appointment to complete your check in paperwork.  If you are unable to arrive 30 min prior to your appointment time we may have to cancel or reschedule you. LAPAROSCOPIC SURGERY: POST OP INSTRUCTIONS Always review your discharge instruction sheet given to you by the facility where your surgery was performed. IF YOU HAVE DISABILITY OR FAMILY LEAVE FORMS, YOU MUST BRING THEM TO THE OFFICE FOR PROCESSING.   DO NOT GIVE THEM TO YOUR DOCTOR.  PAIN CONTROL  1. First take acetaminophen (Tylenol) AND/or ibuprofen (Advil) to control your pain after surgery.  Follow directions on package.  Taking acetaminophen (Tylenol) and/or ibuprofen (Advil) regularly after surgery will help to control your pain and lower the amount of prescription pain medication you may need.  You should not take more than 4,000 mg (4 grams) of acetaminophen (Tylenol) in 24 hours.  You should not take ibuprofen (Advil), aleve, motrin, naprosyn or other NSAIDS if you have a history of stomach ulcers or chronic kidney disease.  2. A prescription for pain medication may be given to you upon discharge.  Take your pain medication as prescribed, if you still have uncontrolled pain after taking acetaminophen (Tylenol) or ibuprofen (Advil). 3. Use ice packs to help control pain. 4. If you need a refill on your pain medication, please contact your pharmacy.  They will contact our office to request authorization. Prescriptions will not be filled after 5pm or on week-ends.  HOME MEDICATIONS 5. Take your usually prescribed medications unless otherwise directed.  DIET 6. You should follow a light diet the first few days after arrival home.  Be sure to include lots of fluids daily. Avoid fatty, fried foods.   CONSTIPATION 7. It is common to experience some constipation after surgery and if you are taking pain medication.  Increasing fluid intake and taking a stool  softener (such as Colace) will usually help or prevent this problem from occurring.  A mild laxative (Milk of Magnesia or Miralax) should be taken according to package instructions if there are no bowel movements after 48 hours.  WOUND/INCISION CARE 8. Most patients will experience some swelling and bruising in the area of the incisions.  Ice packs will help.  Swelling and bruising can take several days to resolve.  9. Unless discharge instructions indicate otherwise, follow guidelines below  a. STERI-STRIPS - you may remove your outer bandages 48 hours after surgery, and you may shower at that time.  You have steri-strips (small skin tapes) in place directly over the incision.  These strips should be left on the skin for 7-10 days.   b. DERMABOND/SKIN GLUE - you may shower in 24 hours.  The glue will flake off over the next 2-3 weeks. 10. Any sutures or staples will be removed at the office during your follow-up visit.  ACTIVITIES 11. You may resume regular (light) daily activities beginning the next day--such as daily self-care, walking, climbing stairs--gradually increasing activities as tolerated.  You may have sexual intercourse when it is comfortable.  Refrain from any heavy lifting or straining until approved by your doctor. a. You may drive when you are no longer taking prescription pain medication, you can comfortably wear a seatbelt, and you can safely maneuver your car and apply brakes.  FOLLOW-UP 12. You should see your doctor in the office for a follow-up appointment approximately 2-3 weeks after your surgery.  You should have been given your post-op/follow-up appointment when   your surgery was scheduled.  If you did not receive a post-op/follow-up appointment, make sure that you call for this appointment within a day or two after you arrive home to insure a convenient appointment time.   WHEN TO CALL YOUR DOCTOR: 1. Fever over 101.0 2. Inability to urinate 3. Continued bleeding from  incision. 4. Increased pain, redness, or drainage from the incision. 5. Increasing abdominal pain  The clinic staff is available to answer your questions during regular business hours.  Please don't hesitate to call and ask to speak to one of the nurses for clinical concerns.  If you have a medical emergency, go to the nearest emergency room or call 911.  A surgeon from [facility] is always on call at the hospital. [address], [address], [place], [place]  [address] ? [address] [address], [place], [place]   [address] [phone] ? [phone] ? FAX [phone]  .........   Managing Your Pain After Surgery Without Opioids    Thank you for participating in our program to help patients manage their pain after surgery without opioids. This is part of our effort to provide you with the best care possible, without exposing you or your family to the risk that opioids pose.  What pain can I expect after surgery? You can expect to have some pain after surgery. This is normal. The pain is typically worse the day after surgery, and quickly begins to get better. Many studies have found that many patients are able to manage their pain after surgery with Over-the-Counter (OTC) medications such as Tylenol and Motrin. If you have a condition that does not allow you to take Tylenol or Motrin, notify your surgical team.  How will I manage my pain? The best strategy for controlling your pain after surgery is around the clock pain control with Tylenol (acetaminophen) and Motrin (ibuprofen or Advil). Alternating these medications with each other allows you to maximize your pain control. In addition to Tylenol and Motrin, you can use heating pads or ice packs on your incisions to help reduce your pain.  How will I alternate your regular strength over-the-counter pain medication? You will take a dose of pain medication every three hours. ; Start by taking 650 mg of Tylenol (2 pills of 325  mg) ; 3 hours later take 600 mg of Motrin (3 pills of 200 mg) ; 3 hours after taking the Motrin take 650 mg of Tylenol ; 3 hours after that take 600 mg of Motrin.   - 1 -  See example - if your first dose of Tylenol is at 12:00 PM   12:00 PM Tylenol 650 mg (2 pills of 325 mg)  3:00 PM Motrin 600 mg (3 pills of 200 mg)  6:00 PM Tylenol 650 mg (2 pills of 325 mg)  9:00 PM Motrin 600 mg (3 pills of 200 mg)  Continue alternating every 3 hours   We recommend that you follow this schedule around-the-clock for at least 3 days after surgery, or until you feel that it is no longer needed. Use the table on the last page of this handout to keep track of the medications you are taking. Important: Do not take more than 3000mg of Tylenol or 3200mg of Motrin in a 24-hour period. Do not take ibuprofen/Motrin if you have a history of bleeding stomach ulcers, severe kidney disease, &/or actively taking a blood thinner  What if I still have pain? If you have pain that is not   controlled with the over-the-counter pain medications (Tylenol and Motrin or Advil) you might have what we call "breakthrough" pain. You will receive a prescription for a small amount of an opioid pain medication such as Oxycodone, Tramadol, or Tylenol with Codeine. Use these opioid pills in the first 24 hours after surgery if you have breakthrough pain. Do not take more than 1 pill every 4-6 hours.  If you still have uncontrolled pain after using all opioid pills, don't hesitate to call our staff using the number provided. We will help make sure you are managing your pain in the best way possible, and if necessary, we can provide a prescription for additional pain medication.   Day 1    Time  Name of Medication Number of pills taken  Amount of Acetaminophen  Pain Level   Comments  AM PM       AM PM       AM PM       AM PM       AM PM       AM PM       AM PM       AM PM       Total Daily amount of Acetaminophen Do not  take more than  3,000 mg per day      Day 2    Time  Name of Medication Number of pills taken  Amount of Acetaminophen  Pain Level   Comments  AM PM       AM PM       AM PM       AM PM       AM PM       AM PM       AM PM       AM PM       Total Daily amount of Acetaminophen Do not take more than  3,000 mg per day      Day 3    Time  Name of Medication Number of pills taken  Amount of Acetaminophen  Pain Level   Comments  AM PM       AM PM       AM PM       AM PM          AM PM       AM PM       AM PM       AM PM       Total Daily amount of Acetaminophen Do not take more than  3,000 mg per day      Day 4    Time  Name of Medication Number of pills taken  Amount of Acetaminophen  Pain Level   Comments  AM PM       AM PM       AM PM       AM PM       AM PM       AM PM       AM PM       AM PM       Total Daily amount of Acetaminophen Do not take more than  3,000 mg per day      Day 5    Time  Name of Medication Number of pills taken  Amount of Acetaminophen  Pain Level   Comments  AM PM       AM PM       AM   PM       AM PM       AM PM       AM PM       AM PM       AM PM       Total Daily amount of Acetaminophen Do not take more than  3,000 mg per day       Day 6    Time  Name of Medication Number of pills taken  Amount of Acetaminophen  Pain Level  Comments  AM PM       AM PM       AM PM       AM PM       AM PM       AM PM       AM PM       AM PM       Total Daily amount of Acetaminophen Do not take more than  3,000 mg per day      Day 7    Time  Name of Medication Number of pills taken  Amount of Acetaminophen  Pain Level   Comments  AM PM       AM PM       AM PM       AM PM       AM PM       AM PM       AM PM       AM PM       Total Daily amount of Acetaminophen Do not take more than  3,000 mg per day        For additional information about how and where to safely dispose of unused  opioid medications - https://www.morepowerfulnc.org  Disclaimer: This document contains information and/or instructional materials adapted from [facility] for the typical patient with your condition. It does not replace medical advice from your health care provider because your experience may differ from that of the typical patient. Talk to your health care provider if you have any questions about this document, your condition or your treatment plan. Adapted from [facility]   

## 2018-12-16 NOTE — Discharge Summary (Addendum)
Patient ID: Debbie Melendez [id number] 1986/07/31 33 y.o.  Admit date: 12/15/2018 Discharge date: 12/16/2018  Admitting Diagnosis: Acute cholecystitis  Discharge Diagnosis Patient Active Problem List   Diagnosis Date Noted  . Acute cholecystitis 12/15/2018  . Status post primary low transverse cesarean section 05/28/2016  . Normal labor 05/26/2016  . RECTAL BLEEDING 10/06/2008  . B12 DEFICIENCY 03/11/2008  . ANXIETY 03/10/2008  . CONSTIPATION 03/10/2008  . ABDOMINAL PAIN, GENERALIZED 03/10/2008    Consultants None  Reason for Admission: Debbie Melendez an 33 y.o.femalewith hx of anxiety and depression, IBS presents to the ED with 3d hx of abdominal discomfort. Beginning Thursday she was having cramps and diarrhea, this turned into MEG and RUQ pain on Saturday. Associated chills and nausea. Pain radiates to her back. Describes pain as "gnawing" and constant. Nothing seems to make it better or worse. She denies ever having had this before. She does report her brother and father had the same symptoms and were found to have issues with their gallbladder.  She was worked up in the emergency department.  Her WBC was found to be normal.  LFTs were normal.  Lipase was normal.  She underwent right upper quadrant ultrasound which demonstrated cholelithiasis.  Gallbladder wall thickness at the upper limit of normal with some gallbladder wall edema.  Favored to have early acute cholecystitis by ultrasound.  Common bile duct measured 5 mm.  Sonographic Murphy's was negative.  Largest gallstone was noted to be 2 mm on the ultrasound.  Options were discussed with her moving forward and given her ongoing issues with persistent right upper quadrant pain, nausea, and p.o. intolerance, she opted undergo surgical intervention.    Procedures Laparoscopic cholecystectomy  Hospital Course:  The patient was admitted and underwent a laparoscopic cholecystectomy.  The patient tolerated the  procedure well.  On POD 1, the patient was tolerating a regular diet, voiding well, mobilizing, and pain was controlled with oral pain medications.  The patient was stable for DC home at this time with appropriate follow up made. 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   Physical Exam: Gen: Awake and alert Heart: RRR Lungs; CTA b/l Abd: Soft, ND, appropriately tender. +BS. Incision c/d/i.  Msk. No edema  Allergies as of 12/16/2018      Reactions   Amoxicillin Rash   No swelling or SOB per pt report   Ampicillin Rash   No swelling or SOB per pt report   Erythromycin Rash   Penicillins Rash   Has patient had a PCN reaction causing immediate rash, facial/tongue/throat swelling, SOB or lightheadedness with hypotension: no Has patient had a PCN reaction causing severe rash involving mucus membranes or skin necrosis: no Has patient had a PCN reaction that required hospitalization no Has patient had a PCN reaction occurring within the last 10 years: no If all of the above answers are "NO", then may proceed with Cephalosporin use.      Medication List    STOP taking these medications   HYDROmorphone 2 MG tablet Commonly known as:  DILAUDID     TAKE these medications   acetaminophen 325 MG tablet Commonly known as:  TYLENOL Take 2 tablets (650 mg total) by mouth every 4 (four) hours as needed (for pain scale < 4). What changed:  reasons to take this   buPROPion 300 MG 24 hr tablet Commonly known as:  WELLBUTRIN XL Take 300 mg by mouth daily.   docusate sodium 100  MG capsule Commonly known as:  COLACE Take 100 mg by mouth daily.   fluticasone 50 MCG/ACT nasal spray Commonly known as:  FLONASE Place 1 spray into both nostrils daily as needed for allergies or rhinitis.   ibuprofen 200 MG tablet Commonly known as:  ADVIL,MOTRIN Take 600-800 mg by mouth every 6 (six) hours as needed for moderate pain.   ondansetron 4 MG disintegrating tablet Commonly known as:   ZOFRAN-ODT Take 1 tablet (4 mg total) by mouth every 6 (six) hours as needed for nausea.   oxyCODONE 5 MG immediate release tablet Commonly known as:  ROXICODONE Take 1 tablet (5 mg total) by mouth every 6 (six) hours as needed.   polyethylene glycol packet Commonly known as:  MIRALAX / GLYCOLAX Take 17 g by mouth daily as needed for mild constipation.   valACYclovir 500 MG tablet Commonly known as:  VALTREX Take 500 mg by mouth 2 (two) times daily as needed (outbreak).   vitamin B-12 1000 MCG tablet Commonly known as:  CYANOCOBALAMIN Take 2,500 mcg by mouth daily.        Follow-up Information    [facility], [facility]. Go on 12/31/2018.   Specialty:  General Surgery Why:  12/31/2018 @ 1015. Please arrive 30 minutes before your appointment time for paperwork. Please bring a copy of your photo ID and insurance card to your appointment.  Contact information: [address] [place] [address] [phone]           Signed: Leary Roca, [facility] 12/16/2018, 10:02 AM Pager: [phone]

## 2018-12-16 NOTE — Plan of Care (Signed)

## 2018-12-16 NOTE — Progress Notes (Signed)
Patient discharged to home. Verbalizes understanding of all discharge instructions including incision care, discharge medications, and follow up MD visits.  

## 2019-07-22 IMAGING — US US ABDOMEN LIMITED
1 series · 14 of 25 positions shown · non-contrast
Comparison: None.

CLINICAL DATA: Right upper quadrant pain

EXAM:
ULTRASOUND ABDOMEN LIMITED RIGHT UPPER QUADRANT

[Series 1: us abdomen limited · 14 of 73 slices shown]
[im 1/73]
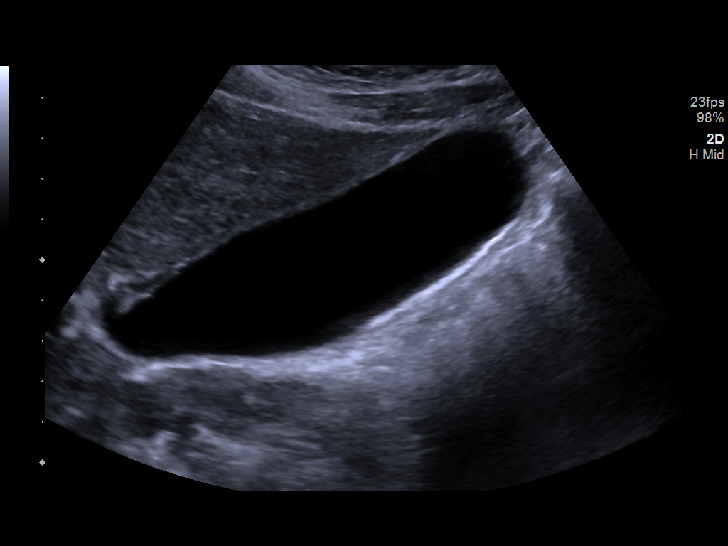
[im 7/73]
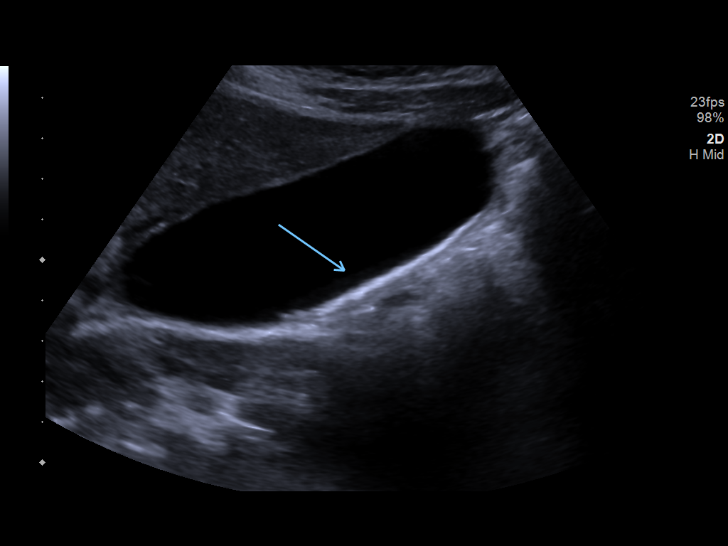
[im 13/73]
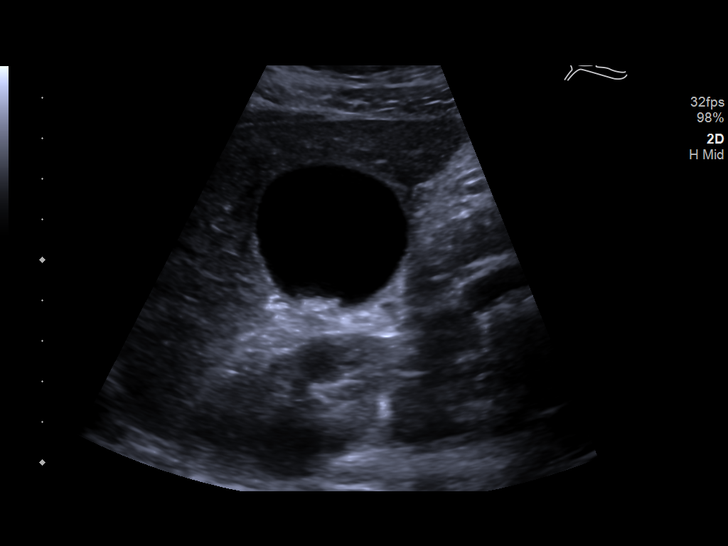
[im 19/73]
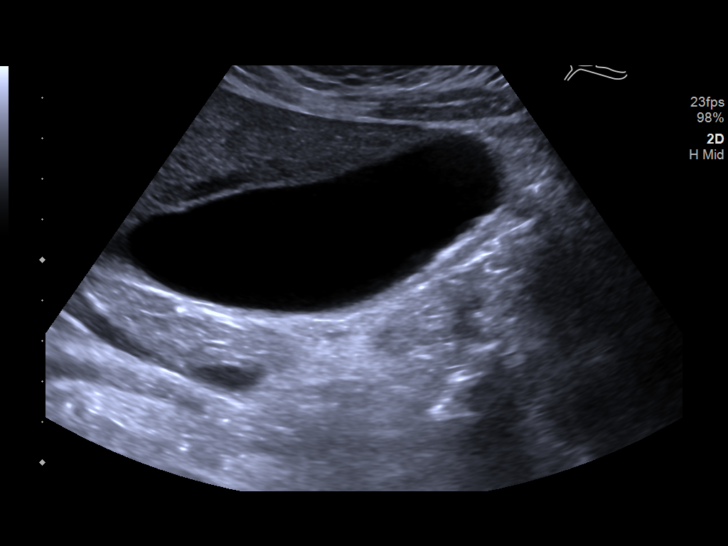
[im 25/73]
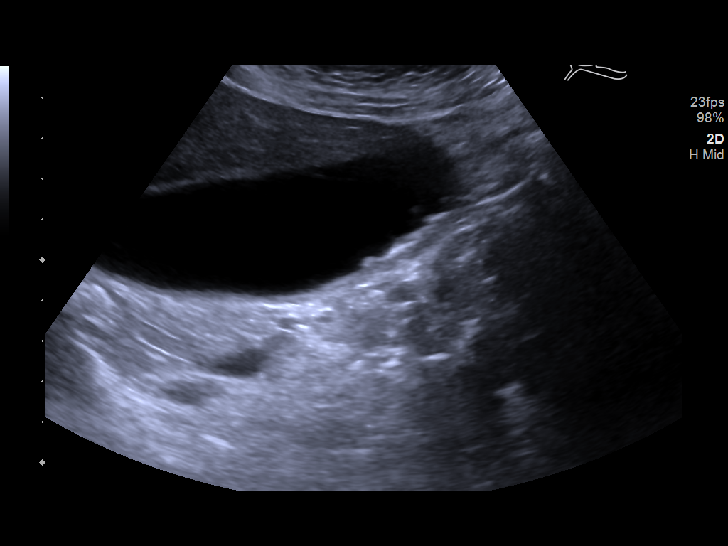
[im 28/73]
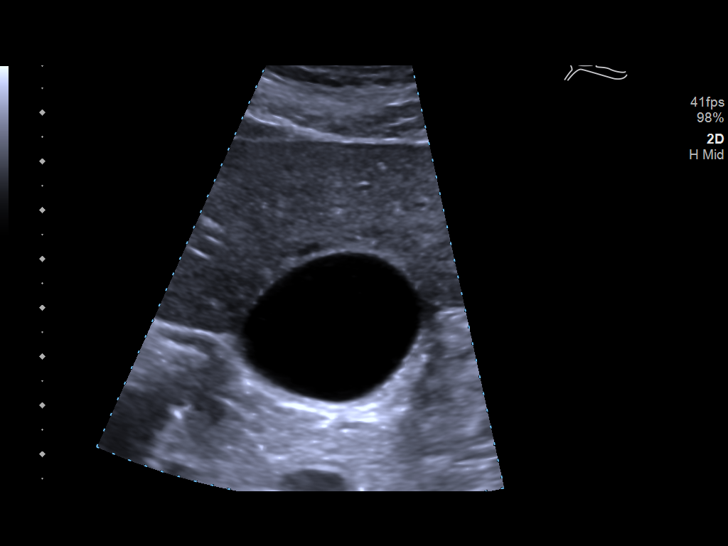
[im 34/73]
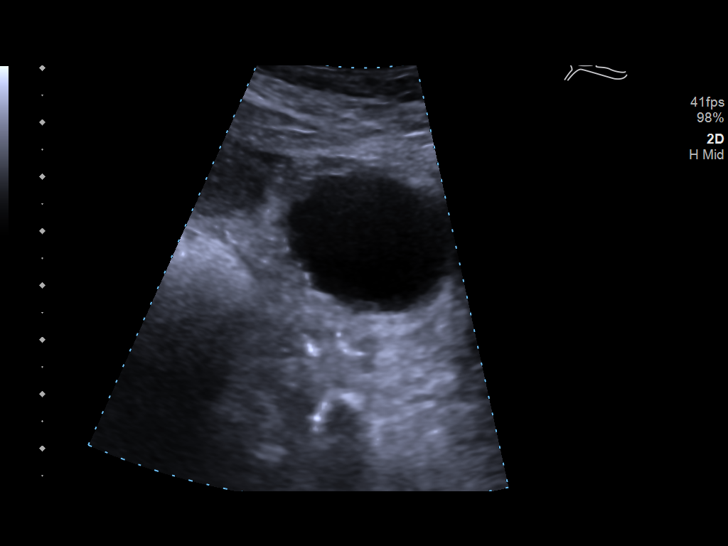
[im 40/73]
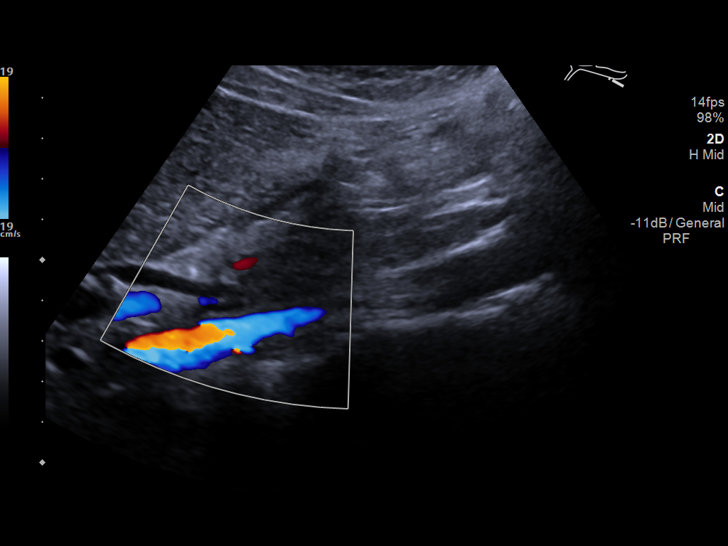
[im 46/73]
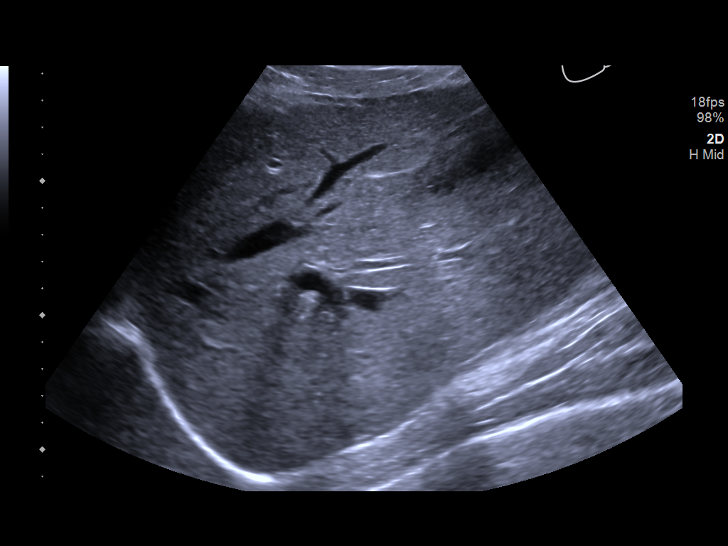
[im 49/73]
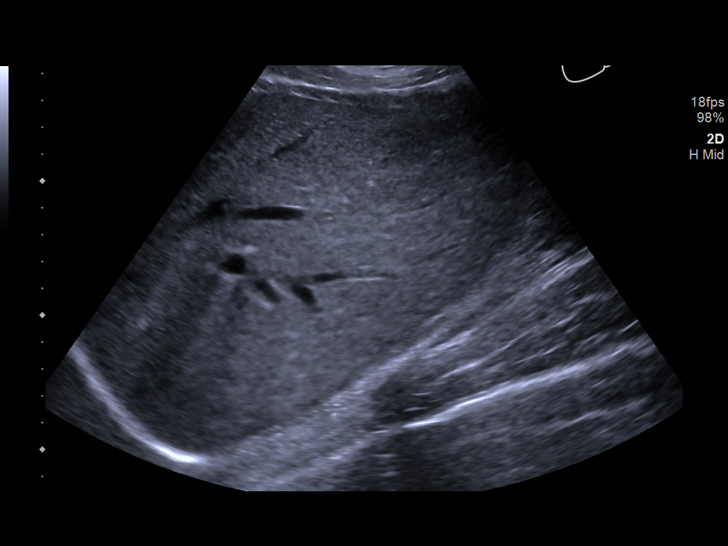
[im 55/73]
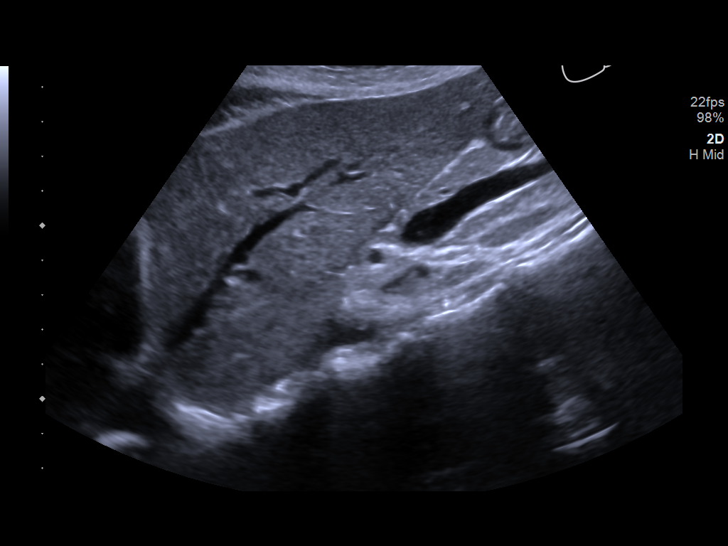
[im 61/73]
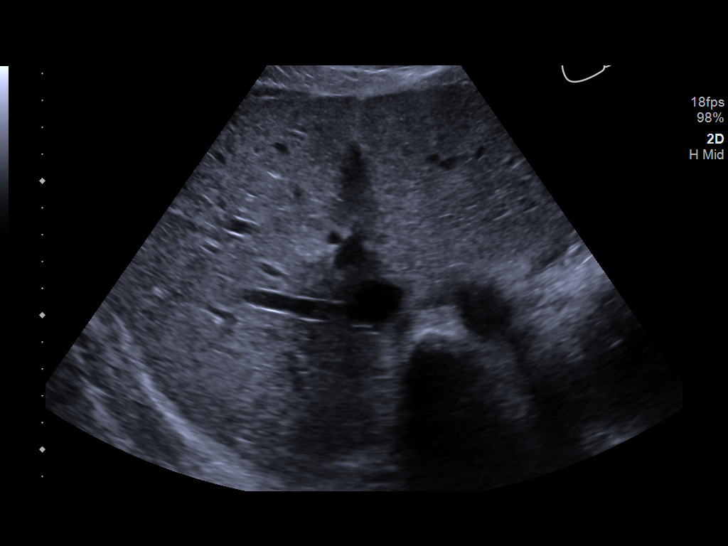
[im 67/73]
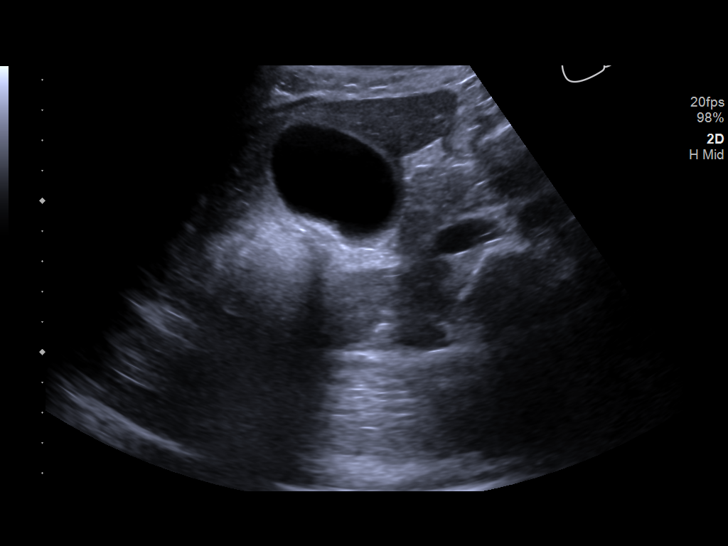
[im 73/73]
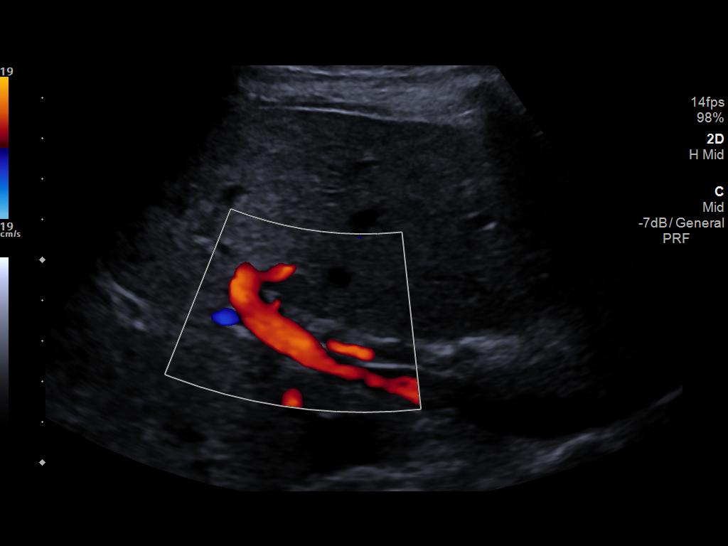

[14 of 25 positions shown; findings below may reference images not displayed]

FINDINGS: Gallbladder:

Within the gallbladder, there are multiple echogenic foci which move
and shadow consistent with cholelithiasis. Largest gallstone
measures 2 mm in length. The gallbladder wall is upper normal in
thickness with subtle gallbladder wall edema. There is no
appreciable pericholecystic fluid.. No sonographic Murphy sign noted
by sonographer.

Common bile duct:

Diameter: 5 mm with tapering to 3 mm distally. No intrahepatic or
extrahepatic biliary duct dilatation.

Liver:

No focal lesion identified. Within normal limits in parenchymal
echogenicity. Portal vein is patent on color Doppler imaging with
normal direction of blood flow towards the liver.
IMPRESSION: Cholelithiasis. Upper normal gallbladder wall thickness with subtle
gallbladder wall edema. Suspect a degree of early acute
cholecystitis. This finding may warrant nuclear medicine
hepatobiliary imaging study to assess for cystic duct patency.

Study otherwise unremarkable.

## 2019-09-12 ENCOUNTER — Other Ambulatory Visit: Payer: Self-pay

## 2019-09-12 DIAGNOSIS — Z20822 Contact with and (suspected) exposure to covid-19: Secondary | ICD-10-CM

## 2019-09-15 LAB — NOVEL CORONAVIRUS, NAA: SARS-CoV-2, NAA: NOT DETECTED

## 2021-11-09 ENCOUNTER — Other Ambulatory Visit: Payer: Self-pay | Admitting: Gastroenterology

## 2021-11-09 DIAGNOSIS — R109 Unspecified abdominal pain: Secondary | ICD-10-CM

## 2021-12-02 ENCOUNTER — Other Ambulatory Visit: Payer: PRIVATE HEALTH INSURANCE

## 2021-12-30 ENCOUNTER — Other Ambulatory Visit: Payer: No Typology Code available for payment source

## 2022-10-30 NOTE — L&D Delivery Note (Addendum)
Delivery Note At 2:36 AM a viable female was delivered via VBAC, Spontaneous (Presentation: Right Occiput Anterior).  APGAR: 8, 9; weight  pending.  Placenta status: Spontaneous, Intact.  Cord: 3 vessels with the following complications: None.  Cord pH: Not collected.  No nuchal cord noted.  Shoulders were delivered without difficulty, Left shoulder was anterior.  Upon delivery, baby was placed on mother's abdomen where it was dried and stimulated.  Pitocin was started at this point.  Baby had a spontaneous cry and good tone noted.  Cord was clamped after 1 minute. Cord cut by father of baby in the room.  Placenta delivered with gentle traction on cord and abdominal counter traction. Anesthesia: Epidural. Episiotomy:   Lacerations: 1st degree. Suture Repair: 3.0 vicryl. Est. Blood Loss (mL):  92 cc.  Mom to postpartum.  Baby to Couplet care / Skin to Skin. Patient expressed desire for postpartum tubal ligation.   Prescilla Sours, MD.  09/25/2023, 3:06 AM

## 2023-02-15 ENCOUNTER — Other Ambulatory Visit: Payer: Self-pay

## 2023-02-15 ENCOUNTER — Inpatient Hospital Stay (HOSPITAL_COMMUNITY)
Admission: AD | Admit: 2023-02-15 | Discharge: 2023-02-15 | Disposition: A | Discharge status: Home / Self Care | Payer: No Typology Code available for payment source | Attending: Obstetrics and Gynecology | Admitting: Obstetrics and Gynecology

## 2023-02-15 ENCOUNTER — Inpatient Hospital Stay (HOSPITAL_COMMUNITY): Payer: No Typology Code available for payment source

## 2023-02-15 ENCOUNTER — Encounter (HOSPITAL_COMMUNITY): Payer: Self-pay | Admitting: Obstetrics and Gynecology

## 2023-02-15 DIAGNOSIS — O3481 Maternal care for other abnormalities of pelvic organs, first trimester: Secondary | ICD-10-CM | POA: Diagnosis not present

## 2023-02-15 DIAGNOSIS — R109 Unspecified abdominal pain: Secondary | ICD-10-CM | POA: Insufficient documentation

## 2023-02-15 DIAGNOSIS — N83202 Unspecified ovarian cyst, left side: Secondary | ICD-10-CM | POA: Diagnosis not present

## 2023-02-15 DIAGNOSIS — Z3A01 Less than 8 weeks gestation of pregnancy: Secondary | ICD-10-CM

## 2023-02-15 DIAGNOSIS — O26891 Other specified pregnancy related conditions, first trimester: Secondary | ICD-10-CM

## 2023-02-15 DIAGNOSIS — N83209 Unspecified ovarian cyst, unspecified side: Secondary | ICD-10-CM

## 2023-02-15 DIAGNOSIS — Z3491 Encounter for supervision of normal pregnancy, unspecified, first trimester: Secondary | ICD-10-CM

## 2023-02-15 LAB — CBC
HCT: 40.1 % (ref 36.0–46.0)
Hemoglobin: 13.6 g/dL (ref 12.0–15.0)
MCH: 31.8 pg (ref 26.0–34.0)
MCHC: 33.9 g/dL (ref 30.0–36.0)
MCV: 93.7 fL (ref 80.0–100.0)
Platelets: 185 10*3/uL (ref 150–400)
RBC: 4.28 MIL/uL (ref 3.87–5.11)
RDW: 12.4 % (ref 11.5–15.5)
WBC: 7.4 10*3/uL (ref 4.0–10.5)
nRBC: 0 % (ref 0.0–0.2)

## 2023-02-15 LAB — WET PREP, GENITAL
Clue Cells Wet Prep HPF POC: NONE SEEN
Sperm: NONE SEEN
Trich, Wet Prep: NONE SEEN
WBC, Wet Prep HPF POC: >=10 — AB (ref <10)
Yeast Wet Prep HPF POC: NONE SEEN

## 2023-02-15 LAB — URINALYSIS, ROUTINE W REFLEX MICROSCOPIC
Bilirubin Urine: NEGATIVE
Glucose, UA: NEGATIVE mg/dL
Hgb urine dipstick: NEGATIVE
Ketones, ur: 5 mg/dL — AB
Leukocytes,Ua: NEGATIVE
Nitrite: NEGATIVE
Protein, ur: NEGATIVE mg/dL
Specific Gravity, Urine: 1.009 (ref 1.005–1.030)
pH: 7 (ref 5.0–8.0)

## 2023-02-15 LAB — HCG, QUANTITATIVE, PREGNANCY: hCG, Beta Chain, Quant, S: 114160 m[IU]/mL — ABNORMAL HIGH (ref <5)

## 2023-02-15 LAB — POCT PREGNANCY, URINE: Preg Test, Ur: POSITIVE — AB

## 2023-02-15 NOTE — MAU Note (Signed)
.  Debbie Melendez is a 36 y.o. at Unknown here in MAU reporting: she's [redacted] weeks pregnant and began having intermittent back and pelvic pain that yesterday.  Hasn't taken any meds to treat.  Reports also had brown/pinkish red  vaginal discharge yesterday, but none noted today.   LMP: 12/29/2022 Onset of complaint: yesterday. Pain score: 3 Vitals:   02/15/23 1050  BP: 131/86  Pulse: 72  Resp: 18  Temp: 98.1 F (36.7 C)  SpO2: 100%     FHT:NA Lab orders placed from triage:   UPT

## 2023-02-15 NOTE — MAU Provider Note (Signed)
History     [id number]  Arrival date and time: 02/15/23 1007    Chief Complaint  Patient presents with   Pelvic Pain   Back Pain   Vaginal Discharge     HPI Debbie Melendez is a 37 y.o. at [redacted]w[redacted]d by LMP who presents for back & pelvic pain. Symptoms started yesterday. Has had some brown discharge as well. Denies vomiting, diarrhea, constipation, dysuria, vaginal irritation, or recent intercourse.    OB History     Gravida  2   Para  1   Term  1   Preterm      AB      Living  1      SAB      IAB      Ectopic      Multiple  0   Live Births  1           Past Medical History:  Diagnosis Date   Anemia    Anxiety    Depression    Strep throat    Ulcerative colitis     Past Surgical History:  Procedure Laterality Date   ADENOIDECTOMY     CESAREAN SECTION N/A 05/26/2016   Procedure: CESAREAN SECTION;  Surgeon: Myna Hidalgo, DO;  [facility];  Service: Obstetrics;  Laterality: N/A;   CHOLECYSTECTOMY N/A 12/15/2018   Procedure: LAPAROSCOPIC CHOLECYSTECTOMY;  Surgeon: Andria Meuse, MD;  Location: [facility];  Service: General;  Laterality: N/A;   TONSILLECTOMY      Family History  Problem Relation Age of Onset   Cancer Other    Hypertension Other    Thyroid disease Other    Heart failure Other     Social History   Socioeconomic History   Marital status: Married    Spouse name: Not on file   Number of children: Not on file   Years of education: Not on file   Highest education level: Not on file  Occupational History   Not on file  Tobacco Use   Smoking status: Never   Smokeless tobacco: Never  Vaping Use   Vaping Use: Never used  Substance and Sexual Activity   Alcohol use: Yes   Drug use: No   Sexual activity: Yes    Birth control/protection: None  Other Topics Concern   Not on file  Social History Narrative   Not on file   Social Determinants of Health   Financial Resource Strain: Not on file  Food  Insecurity: Not on file  Transportation Needs: Not on file  Physical Activity: Not on file  Stress: Not on file  Social Connections: Not on file  Intimate Partner Violence: Not on file    Allergies  Allergen Reactions   Amoxicillin Rash    No swelling or SOB per pt report   Ampicillin Rash    No swelling or SOB per pt report   Erythromycin Rash   Penicillins Rash    Has patient had a PCN reaction causing immediate rash, facial/tongue/throat swelling, SOB or lightheadedness with hypotension: no Has patient had a PCN reaction causing severe rash involving mucus membranes or skin necrosis: no Has patient had a PCN reaction that required hospitalization no Has patient had a PCN reaction occurring within the last 10 years: no If all of the above answers are "NO", then may proceed with Cephalosporin use.     No current facility-administered medications on file prior to encounter.   Current Outpatient Medications on File Prior  to Encounter  Medication Sig Dispense Refill   acetaminophen (TYLENOL) 325 MG tablet Take 2 tablets (650 mg total) by mouth every 4 (four) hours as needed (for pain scale < 4). (Patient taking differently: Take 650 mg by mouth every 4 (four) hours as needed for mild pain (for pain scale < 4). ) 30 tablet 1   buPROPion (WELLBUTRIN XL) 300 MG 24 hr tablet Take 300 mg by mouth daily.     docusate sodium (COLACE) 100 MG capsule Take 100 mg by mouth daily.     fluticasone (FLONASE) 50 MCG/ACT nasal spray Place 1 spray into both nostrils daily as needed for allergies or rhinitis.     ondansetron (ZOFRAN-ODT) 4 MG disintegrating tablet Take 1 tablet (4 mg total) by mouth every 6 (six) hours as needed for nausea. 20 tablet 0   valACYclovir (VALTREX) 500 MG tablet Take 500 mg by mouth 2 (two) times daily as needed (outbreak).   6   vitamin B-12 (CYANOCOBALAMIN) 1000 MCG tablet Take 2,500 mcg by mouth daily.       ROS Pertinent positives and negative per HPI, all others  reviewed and negative  Physical Exam   BP 131/86 (BP Location: Right Arm)   Pulse 72   Temp 98.1 F (36.7 C) (Oral)   Resp 18   Ht 5\' 2"  (1.575 m)   Wt 55.6 kg   LMP 12/29/2022   SpO2 100%   BMI 22.41 kg/m   No data found.   Physical Exam Vitals and nursing note reviewed.  Constitutional:      General: She is not in acute distress.    Appearance: She is well-developed. She is not ill-appearing.  HENT:     Head: Normocephalic and atraumatic.  Eyes:     General: No scleral icterus.       Right eye: No discharge.        Left eye: No discharge.     Conjunctiva/sclera: Conjunctivae normal.  Pulmonary:     Effort: Pulmonary effort is normal. No respiratory distress.  Neurological:     General: No focal deficit present.     Mental Status: She is alert.  Psychiatric:        Mood and Affect: Mood normal.        Behavior: Behavior normal.        Labs Results for orders placed or performed during the hospital encounter of 02/15/23 (from the past 48 hour(s))  Pregnancy, urine POC     Status: Abnormal   Collection Time: 02/15/23 10:58 AM  Result Value Ref Range   Preg Test, Ur POSITIVE (A) NEGATIVE    Comment:        THE SENSITIVITY OF THIS METHODOLOGY IS >24 mIU/mL   Urinalysis, Routine w reflex microscopic -Urine, Clean Catch     Status: Abnormal   Collection Time: 02/15/23 11:43 AM  Result Value Ref Range   Color, Urine YELLOW YELLOW   APPearance CLEAR CLEAR   Specific Gravity, Urine 1.009 1.005 - 1.030   pH 7.0 5.0 - 8.0   Glucose, UA NEGATIVE NEGATIVE mg/dL   Hgb urine dipstick NEGATIVE NEGATIVE   Bilirubin Urine NEGATIVE NEGATIVE   Ketones, ur 5 (A) NEGATIVE mg/dL   Protein, ur NEGATIVE NEGATIVE mg/dL   Nitrite NEGATIVE NEGATIVE   Leukocytes,Ua NEGATIVE NEGATIVE    Comment: Performed at [facility], [address] [place], [place] [address]  GC/Chlamydia probe amp ([place])not at [facility]     Status: None  Collection Time: 02/15/23 11:58 AM   Result Value Ref Range   Neisseria Gonorrhea Negative    Chlamydia Negative    Comment Normal Reference Ranger Chlamydia - Negative    Comment      Normal Reference Range Neisseria Gonorrhea - Negative  CBC     Status: None   Collection Time: 02/15/23 12:03 PM  Result Value Ref Range   WBC 7.4 4.0 - 10.5 K/uL   RBC 4.28 3.87 - 5.11 MIL/uL   Hemoglobin 13.6 12.0 - 15.0 g/dL   HCT 78.2 95.6 - 21.3 %   MCV 93.7 80.0 - 100.0 fL   MCH 31.8 26.0 - 34.0 pg   MCHC 33.9 30.0 - 36.0 g/dL   RDW 08.6 57.8 - 46.9 %   Platelets 185 150 - 400 K/uL   nRBC 0.0 0.0 - 0.2 %    Comment: Performed at [facility], [address] [place], [place] [address]  hCG, quantitative, pregnancy     Status: Abnormal   Collection Time: 02/15/23 12:03 PM  Result Value Ref Range   hCG, Beta Chain, Quant, S 114,160 (H) <5 mIU/mL    Comment:          GEST. AGE      CONC.  (mIU/mL)   <=1 WEEK        5 - 50     2 WEEKS       50 - 500     3 WEEKS       100 - 10,000     4 WEEKS     1,000 - 30,000     5 WEEKS     3,500 - 115,000   6-8 WEEKS     12,000 - 270,000    12 WEEKS     15,000 - 220,000        FEMALE AND NON-PREGNANT FEMALE:     LESS THAN 5 mIU/mL Performed at [facility], [address], [place], [place] [address]   Wet prep, genital     Status: Abnormal   Collection Time: 02/15/23 12:04 PM  Result Value Ref Range   Yeast Wet Prep HPF POC NONE SEEN NONE SEEN   Trich, Wet Prep NONE SEEN NONE SEEN   Clue Cells Wet Prep HPF POC NONE SEEN NONE SEEN   WBC, Wet Prep HPF POC >=10 (A) <10   Sperm NONE SEEN     Comment: Performed at [facility], [address] [place], [place] [address]      Imaging US OB LESS THAN 14 WEEKS WITH [place] TRANSVAGINAL  Result Date: 02/15/2023 CLINICAL DATA:  [id number] Abdominal pain during pregnancy in first trimester [id number] EXAM: OBSTETRIC <14 WK [place] AND TRANSVAGINAL OB US TECHNIQUE: Both transabdominal and transvaginal ultrasound examinations were performed  for complete evaluation of the gestation as well as the maternal uterus, adnexal regions, and pelvic cul-de-sac. Transvaginal technique was performed to assess early pregnancy. COMPARISON:  Ultrasound 08/18/2009. FINDINGS: Intrauterine gestational sac: Single Yolk sac:  Visualized. Embryo:  Visualized. Cardiac Activity: Visualized. Heart Rate: 131 bpm CRL:  8.0 mm   6 w   5 d                  [place] EDC: 10/06/2023 Subchorionic hemorrhage:  None visualized. Maternal uterus/adnexae: There is a simple cyst in the left ovary measuring 5.3 x 2.6 x 3.9 cm. The patient previously had a left ovarian cyst identified on ultrasound in October 2010  which measured 6.6 x 3.8 x 3.5 cm at that time. There is peripheral color flow detected in the left ovary. Normal right ovary. IMPRESSION: Single viable intrauterine pregnancy. Estimated gestational age [redacted] weeks 5 days. 5.3 cm simple left ovarian cyst, previously measured up to 6.6 cm in October 2010. Electronically Signed   By: Caprice Renshaw M.D.   On: 02/15/2023 13:38    MAU Course  Procedures Lab Orders         Wet prep, genital         Urinalysis, Routine w reflex microscopic -Urine, Clean Catch         CBC         hCG, quantitative, pregnancy         Pregnancy, urine POC    No orders of the defined types were placed in this encounter.  Imaging Orders         US OB LESS THAN 14 WEEKS WITH OB TRANSVAGINAL      MDM low  Assessment and Plan   1. Abdominal pain during pregnancy in first trimester   2. Normal IUP (intrauterine pregnancy) on prenatal ultrasound, first trimester   3. Ovarian cyst during pregnancy in first trimester   4. [redacted] weeks gestation of pregnancy    -Patient presents with intermittent cramping & back pain. Ultrasound shows live IUP, size consistent with LMP dating. Also has left ovarian cyst measuring 5.5 cm. Previously imaging showed that it was 6.6 cm. No evidence of torsion.  -Start prenatal care. Reviewed reasons to return to MAU   Dispo:  discharged to home in stable condition.   Discharge Instructions     Discharge patient   Complete by: As directed    Discharge disposition: 01-Home or Self Care   Discharge patient date: 02/15/2023       Judeth Horn, NP 02/16/23 7:56 PM  Allergies as of 02/15/2023       Reactions   Amoxicillin Rash   No swelling or SOB per pt report   Ampicillin Rash   No swelling or SOB per pt report   Erythromycin Rash   Penicillins Rash   Has patient had a PCN reaction causing immediate rash, facial/tongue/throat swelling, SOB or lightheadedness with hypotension: no Has patient had a PCN reaction causing severe rash involving mucus membranes or skin necrosis: no Has patient had a PCN reaction that required hospitalization no Has patient had a PCN reaction occurring within the last 10 years: no If all of the above answers are "NO", then may proceed with Cephalosporin use.        Medication List     STOP taking these medications    ibuprofen 200 MG tablet Commonly known as: ADVIL   oxyCODONE 5 MG immediate release tablet Commonly known as: Roxicodone   polyethylene glycol 17 g packet Commonly known as: MIRALAX / GLYCOLAX       TAKE these medications    acetaminophen 325 MG tablet Commonly known as: Tylenol Take 2 tablets (650 mg total) by mouth every 4 (four) hours as needed (for pain scale < 4). What changed: reasons to take this   buPROPion 300 MG 24 hr tablet Commonly known as: WELLBUTRIN XL Take 300 mg by mouth daily.   cyanocobalamin 1000 MCG tablet Commonly known as: VITAMIN B12 Take 2,500 mcg by mouth daily.   docusate sodium 100 MG capsule Commonly known as: COLACE Take 100 mg by mouth daily.   fluticasone 50 MCG/ACT nasal spray Commonly known as: FLONASE  Place 1 spray into both nostrils daily as needed for allergies or rhinitis.   ondansetron 4 MG disintegrating tablet Commonly known as: ZOFRAN-ODT Take 1 tablet (4 mg total) by mouth every 6 (six)  hours as needed for nausea.   valACYclovir 500 MG tablet Commonly known as: VALTREX Take 500 mg by mouth 2 (two) times daily as needed (outbreak).

## 2023-02-15 NOTE — Discharge Instructions (Signed)
Return to care  If you have heavier bleeding that soaks through more than 2 pads per hour for an hour or more If you bleed so much that you feel like you might pass out or you do pass out If you have significant abdominal pain that is not improved with Tylenol   

## 2023-02-16 LAB — GC/CHLAMYDIA PROBE AMP (~~LOC~~) NOT AT ARMC
Chlamydia: NEGATIVE
Comment: NEGATIVE
Comment: NORMAL
Neisseria Gonorrhea: NEGATIVE

## 2023-02-23 LAB — OB RESULTS CONSOLE HIV ANTIBODY (ROUTINE TESTING): HIV: NONREACTIVE

## 2023-02-23 LAB — OB RESULTS CONSOLE ANTIBODY SCREEN: Antibody Screen: NEGATIVE

## 2023-02-23 LAB — OB RESULTS CONSOLE HEPATITIS B SURFACE ANTIGEN: Hepatitis B Surface Ag: NEGATIVE

## 2023-02-23 LAB — OB RESULTS CONSOLE RUBELLA ANTIBODY, IGM: Rubella: IMMUNE

## 2023-02-23 LAB — HEPATITIS C ANTIBODY: HCV Ab: NEGATIVE

## 2023-03-09 LAB — OB RESULTS CONSOLE GC/CHLAMYDIA
Chlamydia: NEGATIVE
Neisseria Gonorrhea: NEGATIVE

## 2023-07-03 LAB — OB RESULTS CONSOLE RPR: RPR: NONREACTIVE

## 2023-09-15 LAB — OB RESULTS CONSOLE GBS: GBS: POSITIVE

## 2023-09-19 ENCOUNTER — Telehealth (HOSPITAL_COMMUNITY): Payer: Self-pay | Admitting: *Deleted

## 2023-09-19 ENCOUNTER — Encounter (HOSPITAL_COMMUNITY): Payer: Self-pay

## 2023-09-19 NOTE — Patient Instructions (Signed)
Rayna L Lewter  09/19/2023   Your procedure is scheduled on:  10/02/2023  Arrive at 0530 at Entrance C on [employer] at [facility]  and [employer]. You are invited to use the FREE valet parking or use the Visitor's parking deck.  Pick up the phone at the desk and dial [phone].  Call this number if you have problems the morning of surgery: [phone]  Remember:   Do not eat food:(After Midnight) Desps de medianoche.  You may drink clear liquids until arrival at __0530___.  Clear liquids means a liquid you can see thru.  It can have color such as Cola or Kool aid.  Tea is OK and coffee as long as no milk or creamer of any kind.  Take these medicines the morning of surgery with A SIP OF WATER:  Take Wellbutrin and valtrex as prescribed   Do not wear jewelry, make-up or nail polish.  Do not wear lotions, powders, or perfumes. Do not wear deodorant.  Do not shave 48 hours prior to surgery.  Do not bring valuables to the hospital.  [facility] is not   responsible for any belongings or valuables brought to the hospital.  Contacts, dentures or bridgework may not be worn into surgery.  Leave suitcase in the car. After surgery it may be brought to your room.  For patients admitted to the hospital, checkout time is 11:00 AM the day of              discharge.      Please read over the following fact sheets that you were given:     Preparing for Surgery

## 2023-09-19 NOTE — Telephone Encounter (Signed)
Preadmission screen  

## 2023-09-20 ENCOUNTER — Telehealth (HOSPITAL_COMMUNITY): Payer: Self-pay | Admitting: *Deleted

## 2023-09-20 ENCOUNTER — Encounter (HOSPITAL_COMMUNITY): Payer: Self-pay

## 2023-09-20 NOTE — Telephone Encounter (Signed)
Preadmission screen  

## 2023-09-24 ENCOUNTER — Inpatient Hospital Stay (HOSPITAL_COMMUNITY)
Admission: AD | Admit: 2023-09-24 | Discharge: 2023-09-26 | DRG: 806 | Disposition: A | Discharge status: Home / Self Care | Payer: No Typology Code available for payment source | Attending: Obstetrics and Gynecology | Admitting: Obstetrics and Gynecology

## 2023-09-24 ENCOUNTER — Encounter (HOSPITAL_COMMUNITY): Payer: Self-pay | Admitting: Certified Registered Nurse Anesthetist

## 2023-09-24 ENCOUNTER — Encounter (HOSPITAL_COMMUNITY)
Admission: AD | Disposition: A | Discharge status: Home / Self Care | Payer: Self-pay | Source: Home / Self Care | Attending: Obstetrics and Gynecology

## 2023-09-24 ENCOUNTER — Encounter (HOSPITAL_COMMUNITY): Payer: Self-pay | Admitting: Obstetrics and Gynecology

## 2023-09-24 ENCOUNTER — Inpatient Hospital Stay (HOSPITAL_COMMUNITY): Payer: No Typology Code available for payment source | Admitting: Anesthesiology

## 2023-09-24 DIAGNOSIS — Z880 Allergy status to penicillin: Secondary | ICD-10-CM | POA: Diagnosis not present

## 2023-09-24 DIAGNOSIS — Z3A38 38 weeks gestation of pregnancy: Secondary | ICD-10-CM

## 2023-09-24 DIAGNOSIS — Z87891 Personal history of nicotine dependence: Secondary | ICD-10-CM | POA: Diagnosis not present

## 2023-09-24 DIAGNOSIS — K219 Gastro-esophageal reflux disease without esophagitis: Secondary | ICD-10-CM | POA: Diagnosis present

## 2023-09-24 DIAGNOSIS — O99824 Streptococcus B carrier state complicating childbirth: Secondary | ICD-10-CM | POA: Diagnosis present

## 2023-09-24 DIAGNOSIS — A6000 Herpesviral infection of urogenital system, unspecified: Secondary | ICD-10-CM | POA: Diagnosis present

## 2023-09-24 DIAGNOSIS — Z8249 Family history of ischemic heart disease and other diseases of the circulatory system: Secondary | ICD-10-CM | POA: Diagnosis not present

## 2023-09-24 DIAGNOSIS — O9912 Other diseases of the blood and blood-forming organs and certain disorders involving the immune mechanism complicating childbirth: Secondary | ICD-10-CM | POA: Diagnosis present

## 2023-09-24 DIAGNOSIS — Z79899 Other long term (current) drug therapy: Secondary | ICD-10-CM

## 2023-09-24 DIAGNOSIS — Z98891 History of uterine scar from previous surgery: Principal | ICD-10-CM

## 2023-09-24 DIAGNOSIS — O34219 Maternal care for unspecified type scar from previous cesarean delivery: Secondary | ICD-10-CM | POA: Diagnosis present

## 2023-09-24 DIAGNOSIS — D696 Thrombocytopenia, unspecified: Secondary | ICD-10-CM | POA: Diagnosis present

## 2023-09-24 DIAGNOSIS — O9962 Diseases of the digestive system complicating childbirth: Secondary | ICD-10-CM | POA: Diagnosis present

## 2023-09-24 DIAGNOSIS — O26893 Other specified pregnancy related conditions, third trimester: Secondary | ICD-10-CM | POA: Diagnosis present

## 2023-09-24 DIAGNOSIS — O9832 Other infections with a predominantly sexual mode of transmission complicating childbirth: Secondary | ICD-10-CM | POA: Diagnosis present

## 2023-09-24 LAB — POCT FERN TEST: POCT Fern Test: POSITIVE

## 2023-09-24 LAB — CBC
HCT: 37.6 % (ref 36.0–46.0)
Hemoglobin: 13 g/dL (ref 12.0–15.0)
MCH: 34.5 pg — ABNORMAL HIGH (ref 26.0–34.0)
MCHC: 34.6 g/dL (ref 30.0–36.0)
MCV: 99.7 fL (ref 80.0–100.0)
Platelets: 109 10*3/uL — ABNORMAL LOW (ref 150–400)
RBC: 3.77 MIL/uL — ABNORMAL LOW (ref 3.87–5.11)
RDW: 13.9 % (ref 11.5–15.5)
WBC: 9 10*3/uL (ref 4.0–10.5)
nRBC: 0 % (ref 0.0–0.2)

## 2023-09-24 LAB — TYPE AND SCREEN
ABO/RH(D): A POS
Antibody Screen: NEGATIVE

## 2023-09-24 SURGERY — Surgical Case
Anesthesia: Regional | Laterality: Bilateral

## 2023-09-24 MED ORDER — LACTATED RINGERS IV SOLN: INTRAVENOUS | Status: DC

## 2023-09-24 MED ORDER — SOD CITRATE-CITRIC ACID 500-334 MG/5ML PO SOLN: 30.0000 mL | ORAL | Status: DC | PRN

## 2023-09-24 MED ORDER — LIDOCAINE HCL (PF) 1 % IJ SOLN
INTRAMUSCULAR | Status: DC | PRN
  Administered 2023-09-24: 11 mL via EPIDURAL

## 2023-09-24 MED ORDER — OXYTOCIN BOLUS FROM INFUSION
333.0000 mL | Freq: Once | INTRAVENOUS | Status: AC
  Administered 2023-09-25: 333 mL via INTRAVENOUS

## 2023-09-24 MED ORDER — LIDOCAINE HCL (PF) 1 % IJ SOLN: 30.0000 mL | INTRAMUSCULAR | Status: DC | PRN

## 2023-09-24 MED ORDER — SOD CITRATE-CITRIC ACID 500-334 MG/5ML PO SOLN: 30.0000 mL | ORAL | Status: DC

## 2023-09-24 MED ORDER — PHENYLEPHRINE 80 MCG/ML (10ML) SYRINGE FOR IV PUSH (FOR BLOOD PRESSURE SUPPORT)
80.0000 ug | PREFILLED_SYRINGE | INTRAVENOUS | Status: DC | PRN
Start: 2023-09-24 — End: 2023-09-25

## 2023-09-24 MED ORDER — ONDANSETRON HCL 4 MG/2ML IJ SOLN: 4.0000 mg | Freq: Four times a day (QID) | INTRAMUSCULAR | Status: DC | PRN

## 2023-09-24 MED ORDER — PHENYLEPHRINE 80 MCG/ML (10ML) SYRINGE FOR IV PUSH (FOR BLOOD PRESSURE SUPPORT): 80.0000 ug | PREFILLED_SYRINGE | INTRAVENOUS | Status: DC | PRN

## 2023-09-24 MED ORDER — LACTATED RINGERS IV SOLN
500.0000 mL | Freq: Once | INTRAVENOUS | Status: AC
  Administered 2023-09-24: 500 mL via INTRAVENOUS

## 2023-09-24 MED ORDER — DIPHENHYDRAMINE HCL 50 MG/ML IJ SOLN: 12.5000 mg | INTRAMUSCULAR | Status: DC | PRN

## 2023-09-24 MED ORDER — FENTANYL-BUPIVACAINE-NACL 0.5-0.125-0.9 MG/250ML-% EP SOLN
12.0000 mL/h | EPIDURAL | Status: DC | PRN
  Administered 2023-09-24: 12 mL/h via EPIDURAL
  Filled 2023-09-24: qty 250

## 2023-09-24 MED ORDER — OXYTOCIN-SODIUM CHLORIDE 30-0.9 UT/500ML-% IV SOLN
2.5000 [IU]/h | INTRAVENOUS | Status: DC
  Filled 2023-09-24: qty 500

## 2023-09-24 MED ORDER — CEFAZOLIN SODIUM-DEXTROSE 2-4 GM/100ML-% IV SOLN
2.0000 g | INTRAVENOUS | Status: AC
  Administered 2023-09-24: 2 g via INTRAVENOUS
  Filled 2023-09-24: qty 100

## 2023-09-24 MED ORDER — EPHEDRINE 5 MG/ML INJ
10.0000 mg | INTRAVENOUS | Status: DC | PRN
Start: 2023-09-24 — End: 2023-09-25

## 2023-09-24 MED ORDER — LACTATED RINGERS IV SOLN: 500.0000 mL | INTRAVENOUS | Status: DC | PRN

## 2023-09-24 MED ORDER — ACETAMINOPHEN 325 MG PO TABS: 650.0000 mg | ORAL_TABLET | ORAL | Status: DC | PRN

## 2023-09-24 MED ORDER — EPHEDRINE 5 MG/ML INJ: 10.0000 mg | INTRAVENOUS | Status: DC | PRN

## 2023-09-24 MED ORDER — TRANEXAMIC ACID-NACL 1000-0.7 MG/100ML-% IV SOLN: 1000.0000 mg | Freq: Once | INTRAVENOUS | Status: DC

## 2023-09-24 NOTE — H&P (Addendum)
Debbie Melendez is a 37 y.o. female presenting for contractions and spontaneous rupture of membranes at  9 pm today, clear fluid.   At MAU patient was found to be 7cm/100%/0 station dilated and grossly ruptured.  Patient desired a trial of labor and vaginal birth after cesarean section.  She also desires a postpartum tubal ligation via bilateral salpingectomy.  G2P1001 at 38 weeks 3 days EGA, LMP 12/29/22 with EDC 10/05/2023 by LMP and consistent with first trimester ultrasound.    Prenatal care was received at [facility] with Dr. Connye Burkitt.   Prenatal Course has been significant for: One previous cesarean section for arrest of descent. Patient had originally desired a repeat cesarean section but now has changed her mind and desires a TOLAC and VBAC.  History of genital HSV, was taking prophylactic valtrex and without recent outbreak.    OB History     Gravida  2   Para  1   Term  1   Preterm      AB      Living  1      SAB      IAB      Ectopic      Multiple  0   Live Births  1          Past Medical History:  Diagnosis Date   Anemia    Anxiety    Depression    Gestational thrombocytopenia (HCC)    Strep throat    Past Surgical History:  Procedure Laterality Date   ADENOIDECTOMY     CESAREAN SECTION N/A 05/26/2016   Procedure: CESAREAN SECTION;  Surgeon: Myna Hidalgo, DO;  [facility];  Service: Obstetrics;  Laterality: N/A;   CHOLECYSTECTOMY N/A 12/15/2018   Procedure: LAPAROSCOPIC CHOLECYSTECTOMY;  Surgeon: Andria Meuse, MD;  Location: [facility];  Service: General;  Laterality: N/A;   TONSILLECTOMY     Family History: family history includes Cancer in her maternal grandmother; Heart disease in her father; Heart failure in an other family member; Hypertension in an other family member; Thyroid disease in her paternal grandmother. Social History:  reports that she has quit smoking. Her smoking use included cigarettes. She has never used  smokeless tobacco. She reports current alcohol use. She reports that she does not use drugs.     Maternal Diabetes: No Genetic Screening: Normal Maternal Ultrasounds/Referrals: Normal. Last Growth [place] (11/19): [redacted]w[redacted]d, EFW 3076g, 6lb 13oz (43%), AAFV, cephalic, posterior placenta. Fetal Ultrasounds or other Referrals:  None Maternal Substance Abuse:  No Significant Maternal Medications:  None Significant Maternal Lab Results:  Group B Strep positive Number of Prenatal Visits:greater than 3 verified prenatal visits Maternal Vaccinations:TDap, Flu vaccines.  Other Comments:  None  Review of Systems History Dilation: 7.5 Effacement (%): 100 Station: 0 Exam by:: Woodroe Chen, RN Blood pressure (!) 157/92, pulse 88, last menstrual period 12/29/2022, SpO2 99%, unknown if currently breastfeeding.    09/24/2023    9:45 PM 09/24/2023    9:20 PM 09/20/2023    9:33 AM  Vitals with BMI  Height   5\' 1"   Weight   146 lbs  BMI   27.6  Systolic 157 155   Diastolic 92 86   Pulse 88 76    EFM: 130 baseline, moderate variability, reactive, category 1.  TOCO: Contractions every 2 to 3 minutes.  Exam Physical Exam  Constitutional: She is oriented to person, place, and time. She appears well-developed and well-nourished.  HENT:  Head: Normocephalic and  atraumatic.  Neck: Normal range of motion.  Cardiovascular: Normal rate.    Respiratory: Effort normal.   GI: Soft.  Skin: Skin is warm and dry.  Psychiatric: She has a normal mood and affect. Her behavior is normal.   Genitourinary: Gravid uterus, measures appropriate for gestational age.  No vulvovaginal/cervical lesions.   Prenatal labs: ABO, Rh: --/--/PENDING (11/25 2130)  A pos Antibody: PENDING (11/25 2130) Rubella: Immune (04/26 0000) RPR: Nonreactive (09/03 0000)  HBsAg: Negative (04/26 0000)  HIV: Non-reactive (04/26 0000)  GBS: Positive/-- (11/16 0000)   Recent Results (from the past 2160 hour(s))  OB RESULTS CONSOLE RPR      Status: None   Collection Time: 07/03/23 12:00 AM  Result Value Ref Range   RPR Nonreactive   OB RESULT CONSOLE Group B Strep     Status: None   Collection Time: 09/15/23 12:00 AM  Result Value Ref Range   GBS Positive   Fern Test     Status: Abnormal   Collection Time: 09/24/23  9:25 PM  Result Value Ref Range   POCT Fern Test Positive = ruptured amniotic membanes   CBC     Status: Abnormal   Collection Time: 09/24/23  9:30 PM  Result Value Ref Range   WBC 9.0 4.0 - 10.5 K/uL   RBC 3.77 (L) 3.87 - 5.11 MIL/uL   Hemoglobin 13.0 12.0 - 15.0 g/dL   HCT 13.2 44.0 - 10.2 %   MCV 99.7 80.0 - 100.0 fL   MCH 34.5 (H) 26.0 - 34.0 pg   MCHC 34.6 30.0 - 36.0 g/dL   RDW 72.5 36.6 - 44.0 %   Platelets 109 (L) 150 - 400 K/uL    Comment: REPEATED TO VERIFY   nRBC 0.0 0.0 - 0.2 %    Comment: Performed at [facility], [address] [place], [place] [address]  Type and screen [facility]     Status: None   Collection Time: 09/24/23  9:30 PM  Result Value Ref Range   ABO/RH(D) A POS    Antibody Screen NEG    Sample Expiration      09/27/2023,2359 Performed at [facility], [address], [place], [place] [address]       Current Outpatient Medications  Medication Instructions   acetaminophen (TYLENOL) 650 mg, Oral, Every 4 hours PRN   albuterol (VENTOLIN HFA) 108 (90 Base) MCG/ACT inhaler 2 puffs, Inhalation, Every 4 hours PRN   nitrofurantoin (macrocrystal-monohydrate) (MACROBID) 100 mg, Oral, Daily PRN   omeprazole (PRILOSEC) 40 mg, Oral, Every morning   Prenatal Multivit-Min-Fe-FA (PRE-NATAL FORMULA PO) 1 tablet, Oral, Daily   Probiotic Product (PROBIOTIC PO) 1 tablet, Oral, Daily   valACYclovir (VALTREX) 500 mg, Oral, 2 times daily    Allergies  Allergen Reactions   Amoxicillin Rash    No swelling or SOB per pt report   Ampicillin Rash    No swelling or SOB per pt report   Erythromycin Rash   Penicillins Rash    Has patient had a PCN reaction  causing immediate rash, facial/tongue/throat swelling, SOB or lightheadedness with hypotension: no Has patient had a PCN reaction causing severe rash involving mucus membranes or skin necrosis: no Has patient had a PCN reaction that required hospitalization no Has patient had a PCN reaction occurring within the last 10 years: no If all of the above answers are "NO", then may proceed with Cephalosporin use.     Assessment/Plan:  37 y/o  G2P1001 at 38 weeks 3 days EGA in active labor, for trial of labor after previous cesarean section, - Admit to Labor and Delivery as per admit orders.    - Patient has already signed the trial of labor consent form in the MAU.  She expressed understanding the risk of uterine rupture which is associated with trial of labor after a cesarean section and its accompanying comorbidities to her and the baby.    - Patient desires postpartum tubal ligation and we discussed that it may be done by Dr. Connye Burkitt tomorrow.  - Ancef for GBS prophylaxis.  - With elevated blood pressures, likely is pain related, will check preeclampsia labs of blood pressures remain elevated after her epidural. Prescilla Sours, MD.  09/24/2023, 10:10 PM

## 2023-09-24 NOTE — Anesthesia Procedure Notes (Signed)
Epidural Patient location during procedure: OB Start time: 09/24/2023 10:12 PM End time: 09/24/2023 10:29 PM  Staffing Anesthesiologist: Lowella Curb, MD Performed: anesthesiologist   Preanesthetic Checklist Completed: patient identified, IV checked, site marked, risks and benefits discussed, surgical consent, monitors and equipment checked, pre-op evaluation and timeout performed  Epidural Patient position: sitting Prep: ChloraPrep Patient monitoring: heart rate, cardiac monitor, continuous pulse ox and blood pressure Approach: midline Injection technique: LOR air  Needle:  Needle type: Tuohy  Needle gauge: 17 G Needle length: 9 cm Needle insertion depth: 4 cm Catheter type: closed end flexible Catheter size: 20 Guage Catheter at skin depth: 8 cm Test dose: negative  Assessment Events: blood not aspirated, injection not painful, no injection resistance, no paresthesia and negative IV test  Additional Notes Reason for block:procedure for pain

## 2023-09-24 NOTE — MAU Note (Signed)
Pt says SROM at 9pm- clear.  VE on Friday - 4 cm Is sch for repeat C/S on 10-02-2023

## 2023-09-24 NOTE — Anesthesia Preprocedure Evaluation (Signed)
Anesthesia Evaluation  Patient identified by MRN, date of birth, ID band Patient awake    Reviewed: Allergy & Precautions, H&P , Patient's Chart, lab work & pertinent test results  Airway Mallampati: II  TM Distance: >3 FB Neck ROM: full    Dental no notable dental hx.    Pulmonary former smoker   Pulmonary exam normal breath sounds clear to auscultation       Cardiovascular Exercise Tolerance: Good  Rhythm:regular Rate:Normal     Neuro/Psych    GI/Hepatic   Endo/Other    Renal/GU      Musculoskeletal   Abdominal   Peds  Hematology   Anesthesia Other Findings   Reproductive/Obstetrics (+) Pregnancy                             Anesthesia Physical Anesthesia Plan  ASA: II  Anesthesia Plan: Epidural   Post-op Pain Management:    Induction:   PONV Risk Score and Plan:   Airway Management Planned:   Additional Equipment:   Intra-op Plan:   Post-operative Plan:   Informed Consent: I have reviewed the patients History and Physical, chart, labs and discussed the procedure including the risks, benefits and alternatives for the proposed anesthesia with the patient or authorized representative who has indicated his/her understanding and acceptance.     Dental Advisory Given  Plan Discussed with:   Anesthesia Plan Comments: (Labs checked- platelets confirmed with RN in room. Fetal heart tracing, per RN, reported to be stable enough for sitting procedure. Discussed epidural, and patient consents to the procedure:  included risk of possible headache,backache, failed block, allergic reaction, and nerve injury. This patient was asked if she had any questions or concerns before the procedure started.)        Anesthesia Quick Evaluation

## 2023-09-25 ENCOUNTER — Encounter (HOSPITAL_COMMUNITY): Payer: Self-pay | Admitting: Obstetrics & Gynecology

## 2023-09-25 ENCOUNTER — Encounter (HOSPITAL_COMMUNITY)
Admission: AD | Disposition: A | Discharge status: Home / Self Care | Payer: Self-pay | Source: Home / Self Care | Attending: Obstetrics and Gynecology

## 2023-09-25 SURGERY — LIGATION, FALLOPIAN TUBE, POSTPARTUM
Anesthesia: Choice | Laterality: Bilateral

## 2023-09-25 MED ORDER — ACETAMINOPHEN 325 MG PO TABS
650.0000 mg | ORAL_TABLET | ORAL | Status: DC | PRN
  Administered 2023-09-25 – 2023-09-26 (×3): 650 mg via ORAL
  Filled 2023-09-25 (×3): qty 2

## 2023-09-25 MED ORDER — TERBUTALINE SULFATE 1 MG/ML IJ SOLN: 0.2500 mg | Freq: Once | INTRAMUSCULAR | Status: DC | PRN

## 2023-09-25 MED ORDER — MAGNESIUM HYDROXIDE 400 MG/5ML PO SUSP: 30.0000 mL | ORAL | Status: DC | PRN

## 2023-09-25 MED ORDER — LACTATED RINGERS IV SOLN: INTRAVENOUS | Status: AC

## 2023-09-25 MED ORDER — METOCLOPRAMIDE HCL 10 MG PO TABS: 10.0000 mg | ORAL_TABLET | Freq: Once | ORAL | Status: DC

## 2023-09-25 MED ORDER — OXYCODONE HCL 5 MG PO TABS: 10.0000 mg | ORAL_TABLET | ORAL | Status: DC | PRN

## 2023-09-25 MED ORDER — OXYTOCIN-SODIUM CHLORIDE 30-0.9 UT/500ML-% IV SOLN
1.0000 m[IU]/min | INTRAVENOUS | Status: DC
  Administered 2023-09-25: 2 m[IU]/min via INTRAVENOUS

## 2023-09-25 MED ORDER — ONDANSETRON HCL 4 MG PO TABS: 4.0000 mg | ORAL_TABLET | ORAL | Status: DC | PRN

## 2023-09-25 MED ORDER — FAMOTIDINE 20 MG PO TABS: 40.0000 mg | ORAL_TABLET | Freq: Once | ORAL | Status: DC

## 2023-09-25 MED ORDER — SODIUM CHLORIDE 0.9% FLUSH
3.0000 mL | Freq: Two times a day (BID) | INTRAVENOUS | Status: DC
  Administered 2023-09-25 – 2023-09-26 (×2): 3 mL via INTRAVENOUS

## 2023-09-25 MED ORDER — BENZOCAINE-MENTHOL 20-0.5 % EX AERO
1.0000 | INHALATION_SPRAY | CUTANEOUS | Status: DC | PRN
  Administered 2023-09-25: 1 via TOPICAL
  Filled 2023-09-25: qty 56

## 2023-09-25 MED ORDER — DIBUCAINE (PERIANAL) 1 % EX OINT
1.0000 | TOPICAL_OINTMENT | CUTANEOUS | Status: DC | PRN
  Administered 2023-09-26: 1 via RECTAL
  Filled 2023-09-25: qty 28

## 2023-09-25 MED ORDER — ONDANSETRON HCL 4 MG/2ML IJ SOLN: 4.0000 mg | INTRAMUSCULAR | Status: DC | PRN

## 2023-09-25 MED ORDER — OXYCODONE HCL 5 MG PO TABS
5.0000 mg | ORAL_TABLET | ORAL | Status: DC | PRN
  Filled 2023-09-25: qty 1

## 2023-09-25 MED ORDER — IBUPROFEN 600 MG PO TABS
600.0000 mg | ORAL_TABLET | Freq: Four times a day (QID) | ORAL | Status: DC
  Administered 2023-09-25 – 2023-09-26 (×5): 600 mg via ORAL
  Filled 2023-09-25 (×5): qty 1

## 2023-09-25 MED ORDER — PRENATAL MULTIVITAMIN CH
1.0000 | ORAL_TABLET | Freq: Every day | ORAL | Status: DC
  Administered 2023-09-25 – 2023-09-26 (×2): 1 via ORAL
  Filled 2023-09-25 (×2): qty 1

## 2023-09-25 MED ORDER — SIMETHICONE 80 MG PO CHEW: 80.0000 mg | CHEWABLE_TABLET | ORAL | Status: DC | PRN

## 2023-09-25 MED ORDER — SODIUM CHLORIDE 0.9% FLUSH: 3.0000 mL | INTRAVENOUS | Status: DC | PRN

## 2023-09-25 MED ORDER — DIPHENHYDRAMINE HCL 25 MG PO CAPS: 25.0000 mg | ORAL_CAPSULE | Freq: Four times a day (QID) | ORAL | Status: DC | PRN

## 2023-09-25 MED ORDER — SODIUM CHLORIDE 0.9 % IV SOLN: 250.0000 mL | INTRAVENOUS | Status: AC | PRN

## 2023-09-25 MED ORDER — COCONUT OIL OIL
1.0000 | TOPICAL_OIL | Status: DC | PRN
  Administered 2023-09-25: 1 via TOPICAL

## 2023-09-25 MED ORDER — ZOLPIDEM TARTRATE 5 MG PO TABS: 5.0000 mg | ORAL_TABLET | Freq: Every evening | ORAL | Status: DC | PRN

## 2023-09-25 MED ORDER — WITCH HAZEL-GLYCERIN EX PADS
1.0000 | MEDICATED_PAD | CUTANEOUS | Status: DC | PRN
  Administered 2023-09-26: 1 via TOPICAL

## 2023-09-25 NOTE — Progress Notes (Signed)
Postpartum Note Day #1  S:  Patient doing well.  Pain controlled.  Tolerating regular diet. Has not voided yet this AM. Ambulating without difficulty.   Denies fevers, chills, chest pain, SOB, N/V, or worsening bilateral LE edema.  Lochia: Minimal Infant feeding:  Breast Circumcision:  Desires prior to discharge Contraception:  Interval bilateral salpingectomy  O: Temp:  [98 F (36.7 C)-98.6 F (37 C)] 98.1 F (36.7 C) (11/26 0520) Pulse Rate:  [62-97] 62 (11/26 0520) Resp:  [18-20] 18 (11/26 0520) BP: (99-157)/(62-92) 119/76 (11/26 0520) SpO2:  [99 %-100 %] 99 % (11/26 0520) Gen: NAD, pleasant and cooperative CV: Regular rate Resp: Normal work of breathing Abdomen: soft, non-distended, non-tender throughout Uterus: firm, non-tender, below umbilicus Ext: No bilateral LE edema, no bilateral calf tenderness  Labs:  Recent Labs    09/24/23 2130  HGB 13.0  HCT 37.6    A/P: Patient is a 37 y.o. [id number] PPD#1 s/p VBAC.  S/p VBAC - Pain well controlled  - GU: UOP is adequate - GI: Tolerating regular diet - Activity: encouraged sitting up to chair and ambulation as tolerated - DVT Prophylaxis: Ambulation, SCDs in bed - Labs: pending this AM  Gestational Thrombocytopenia - Platelets 109 on admission  Anxiety/depression/PTSD - No medications, mood stable - Will need 2 week EPDS  Desires permanent sterilization - Will plan for interval bilateral salpingectomy - patient in agreement  Circumcision Consent:  Routine circumcisions performed on newborns have been identified as voluntary, elective procedures by [employer] such as the [employer] [facility].  It is considered an elective procedure with no definitive medical indication and carries risks. Risks include but are not limited to bleeding, infection, damage to penis with possible need for further surgery, poor cosmesis, and local anesthetic risks. Circumcision will only be performed if patient is  deemed to have normal anatomy by his Pediatrician, meets adequate criteria for a newborn of similar gestational age after birth and is without infection or other medical issue contraindicating an elective procedure. Patient understands and agrees. Patient discussed with parents of infant..   Disposition:  D/C home PPD#1-2 pending infant discharge.   Steva Ready, DO

## 2023-09-25 NOTE — Lactation Note (Signed)
This note was copied from a baby's chart. Lactation Consultation Note  Patient Name: Debbie Melendez Today's Date: 09/25/2023 Age:39 hours  Reason for consult: Initial assessment;1st time breastfeeding;Early term 37-38.6wks;Breastfeeding assistance;Mother's request  P2, [redacted]w[redacted]d, first time breastfeeding (latching)  Called to room to assist with breastfeeding. Baby had been crying and fussy. Upon arrival to the room  Baby Debbie "Debbie Melendez" was sleeping on mother's chest.  Basic breastfeeding education in football and cross cradle positions. Mother preferred cross cradle. She was taught how to do hand expression and baby was enticed to latch. Baby latched with wide gape and strong tugs to mother's breast and suckled in bursts. With assistance, he latched on and off the breast for 5 minutes. Baby was then placed skin to skin with mother. Baby's head is molded and bruised. Discussed hand placement with latching to avoid infant's head.   Maternal Data Has patient been taught Hand Expression?: Yes Does the patient have breastfeeding experience prior to this delivery?: No How long did the patient breastfeed?: mother pumped for a few days after birth of first son  Feeding Mother's Current Feeding Choice: Breast Milk  LATCH Score Latch: Repeated attempts needed to sustain latch, nipple held in mouth throughout feeding, stimulation needed to elicit sucking reflex.  Audible Swallowing: A few with stimulation  Type of Nipple: Everted at rest and after stimulation  Comfort (Breast/Nipple): Soft / non-tender  Hold (Positioning): Assistance needed to correctly position infant at breast and maintain latch.  LATCH Score: 7   Lactation Tools Discussed/Used Tools: Pump Breast pump type: Manual Pump Education: Setup, frequency, and cleaning Reason for Pumping: given by RN to assist mother with initiating colostrum Pumping frequency: as needed Pumped volume:  (few  drops)  Interventions Interventions: Breast feeding basics reviewed;Assisted with latch;Skin to skin;Hand express;Breast compression;Adjust position;Support pillows;Position options  Discharge    Consult Status Consult Status: Follow-up Date: 09/26/23 Follow-up type: In-patient    Christella Hartigan M 09/25/2023, 8:49 AM

## 2023-09-25 NOTE — Anesthesia Postprocedure Evaluation (Signed)
Anesthesia Post Note  Patient: Tosca L Axelson  Procedure(s) Performed: AN AD HOC LABOR EPIDURAL     Patient location during evaluation: Mother Baby Anesthesia Type: Epidural Level of consciousness: awake, oriented and awake and alert Pain management: pain level controlled Vital Signs Assessment: post-procedure vital signs reviewed and stable Respiratory status: spontaneous breathing, respiratory function stable and nonlabored ventilation Cardiovascular status: stable Postop Assessment: no headache, adequate PO intake, patient able to bend at knees and no apparent nausea or vomiting (Patient hasn't ambulated yet but says numbness is gone and can bend legs at knees easily.) Anesthetic complications: no   No notable events documented.  Last Vitals:  Vitals:   09/25/23 0421 09/25/23 0520  BP: 123/84 119/76  Pulse: 71 62  Resp: 18 18  Temp: 36.8 C 36.7 C  SpO2: 99% 99%    Last Pain:  Vitals:   09/25/23 0520  TempSrc: Oral  PainSc: 0-No pain   Pain Goal: Patients Stated Pain Goal: 0 (09/24/23 2345)                 Minh Jasper

## 2023-09-25 NOTE — Lactation Note (Signed)
This note was copied from a baby's chart. Lactation Consultation Note  Patient Name: Debbie Melendez Today's Date: 09/25/2023 Age:39 hours Reason for consult: Follow-up assessment;1st time breastfeeding;Term;Breastfeeding assistance;Mother's request   Returned to room at nurse request to assist mother with latching. Mother was pumping upon arrival to room. Father at bedside and attentive. Baby was placed at mother's breast in a laid back postion to reduce holding and pressure on infant's head and neck. "Debbie Melendez" latched well and fed for 10 minutes. Father was taught how he can help with latching baby in this position. Infant's head was tilted slightly to the side to observe his face and breathing. Mother states this position is comfortable and feels she may need additional help to place him.    Nurse started mother pumping and parents will feed baby EBM as collected. Infant had large stool after his feeding.  Instructed mother to call for assistance as needed.   Maternal Data    Feeding Mother's Current Feeding Choice: Breast Milk  LATCH Score Latch: Grasps breast easily, tongue down, lips flanged, rhythmical sucking.  Audible Swallowing: A few with stimulation  Type of Nipple: Everted at rest and after stimulation  Comfort (Breast/Nipple): Soft / non-tender  Hold (Positioning): Assistance needed to correctly position infant at breast and maintain latch.  LATCH Score: 8   Lactation Tools Discussed/Used Breast pump type: Double-Electric Breast Pump (started by nurse) Reason for Pumping: DEBP for breast stimulation every 3 hours with goal of supplementing with mother's EBM Pumped volume:  (drops)  Interventions Interventions: Assisted with latch;Skin to skin;Breast compression;Position options;Adjust position     Consult Status Consult Status: Follow-up Date: 09/26/23 Follow-up type: In-patient    Christella Hartigan M 09/25/2023, 4:53 PM

## 2023-09-26 LAB — CBC
HCT: 33.2 % — ABNORMAL LOW (ref 36.0–46.0)
Hemoglobin: 11.3 g/dL — ABNORMAL LOW (ref 12.0–15.0)
MCH: 33.9 pg (ref 26.0–34.0)
MCHC: 34 g/dL (ref 30.0–36.0)
MCV: 99.7 fL (ref 80.0–100.0)
Platelets: 80 10*3/uL — ABNORMAL LOW (ref 150–400)
RBC: 3.33 MIL/uL — ABNORMAL LOW (ref 3.87–5.11)
RDW: 14 % (ref 11.5–15.5)
WBC: 9.1 10*3/uL (ref 4.0–10.5)
nRBC: 0 % (ref 0.0–0.2)

## 2023-09-26 LAB — SURGICAL PATHOLOGY

## 2023-09-26 LAB — RPR: RPR Ser Ql: NONREACTIVE

## 2023-09-26 MED ORDER — HYDROCORT-PRAMOXINE (PERIANAL) 1-1 % EX FOAM
1.0000 | Freq: Two times a day (BID) | CUTANEOUS | Status: DC
  Administered 2023-09-26: 1 via RECTAL
  Filled 2023-09-26: qty 10

## 2023-09-26 MED ORDER — ACETAMINOPHEN 500 MG PO TABS
1000.0000 mg | ORAL_TABLET | Freq: Four times a day (QID) | ORAL | Status: DC | PRN
  Administered 2023-09-26: 1000 mg via ORAL
  Filled 2023-09-26: qty 2

## 2023-09-26 MED ORDER — HYDROCORT-PRAMOXINE (PERIANAL) 1-1 % EX FOAM: 1.0000 | Freq: Two times a day (BID) | CUTANEOUS | 3 refills | Status: AC

## 2023-09-26 NOTE — Lactation Note (Signed)
This note was copied from a baby's chart. Lactation Consultation Note  Patient Name: Debbie Melendez Today's Date: 09/26/2023 Age:39 hours Reason for consult: Follow-up assessment;1st time breastfeeding;Early term 37-38.6wks  P2- MOB reports that infant is not nursing well, so she decided to pump and bottle feed EBM instead of putting infant to the breast. MOB states that she plans on doing this until her milk comes in, then she will have her outpatient LC assist her with latching again. MOB was using a 21 mm flange, so LC resized her at an 18 mm flange/16 mm flange. LC educated MOB on how to size herself each pumping session to determine the right flange to use.  LC reviewed LC services handout, CDC milk storage guidelines and engorgement/breast care. LC encouraged MOB to call for further assistance as needed.  Maternal Data Has patient been taught Hand Expression?: Yes Does the patient have breastfeeding experience prior to this delivery?: No  Feeding Mother's Current Feeding Choice: Breast Milk Nipple Type: Slow - flow  Lactation Tools Discussed/Used Tools: Pump;Flanges;Coconut oil Flange Size: 18 Breast pump type: Manual Pump Education: Milk Storage;Setup, frequency, and cleaning Pumping frequency: 15-20 min every 2-3 hrs  Interventions Interventions: Breast feeding basics reviewed;Hand pump;DEBP;Coconut oil;Education;LC Services brochure  Discharge Discharge Education: Warning signs for feeding baby;Engorgement and breast care Pump: DEBP;Personal;Manual  Consult Status Consult Status: Complete Date: 09/26/23    Dema Severin BS, IBCLC 09/26/2023, 4:46 PM

## 2023-09-26 NOTE — Anesthesia Preprocedure Evaluation (Signed)
Anesthesia Evaluation  Patient identified by MRN, date of birth, ID band Patient awake    Reviewed: Allergy & Precautions, H&P , NPO status , Patient's Chart, lab work & pertinent test results  Airway Mallampati: II  TM Distance: >3 FB Neck ROM: Full    Dental no notable dental hx.    Pulmonary neg pulmonary ROS, former smoker   Pulmonary exam normal breath sounds clear to auscultation       Cardiovascular negative cardio ROS Normal cardiovascular exam Rhythm:Regular Rate:Normal     Neuro/Psych   Anxiety Depression    negative neurological ROS  negative psych ROS   GI/Hepatic negative GI ROS, Neg liver ROS,,,  Endo/Other  negative endocrine ROS    Renal/GU negative Renal ROS  negative genitourinary   Musculoskeletal negative musculoskeletal ROS (+)    Abdominal   Peds negative pediatric ROS (+)  Hematology negative hematology ROS (+)   Anesthesia Other Findings   Reproductive/Obstetrics (+) Pregnancy                             Anesthesia Physical Anesthesia Plan  ASA: 2  Anesthesia Plan: Epidural   Post-op Pain Management:    Induction:   PONV Risk Score and Plan:   Airway Management Planned:   Additional Equipment:   Intra-op Plan:   Post-operative Plan:   Informed Consent:   Plan Discussed with:   Anesthesia Plan Comments:        Anesthesia Quick Evaluation

## 2023-09-26 NOTE — Discharge Summary (Addendum)
Postpartum Discharge Summary  Date of Service: 09/26/23     Patient Name: Debbie Melendez DOB: [birth year] MRN: [id number]  Date of admission: 09/24/2023 Delivery date:09/25/2023 Delivering provider: Hoover Browns Date of discharge: 09/26/2023  Admitting diagnosis: History of cesarean delivery [Z98.891] Normal labor [O80, Z37.9] Intrauterine pregnancy: [redacted]w[redacted]d     Secondary diagnosis:  Principal Problem:   History of cesarean delivery Active Problems:   Normal labor  Additional problems: Gestational thrombocytopenia, History of anxiety/depression/PTSD, History of HSV, AMA, Ulcerative proctitis, Reflux   Discharge diagnosis: Term Pregnancy Delivered                                              Post partum procedures: None Augmentation: N/A Complications: None  Hospital course: Onset of Labor With Vaginal Delivery      37 y.o. yo [id number] at 110w4d was admitted in Active Labor on 09/24/2023. Labor course was complicated by nothing.  Membrane Rupture Time/Date: 9:00 PM,09/24/2023  Delivery Method:VBAC, Spontaneous Operative Delivery:N/A Episiotomy:   Lacerations:  1st degree Patient had a postpartum course uncomplicated. Platelets on admission went from 109 to 80. Outpatient follow-up arranged.  She is ambulating, tolerating a regular diet, passing flatus, and urinating well. Patient is discharged home in stable condition on 09/26/23.  Newborn Data: Birth date:09/25/2023 Birth time:2:36 AM Gender:Female Living status:Living Apgars:8 ,9  Weight:3360 g  Magnesium Sulfate received: No BMZ received: No Rhophylac:N/A MMR:N/A T-DaP:Given prenatally RSV Vaccine received: No Transfusion:No Immunizations administered:  There is no immunization history on file for this patient.  Physical exam  Vitals:   09/25/23 1340 09/25/23 2208 09/26/23 0500 09/26/23 0516  BP: 126/76 (!) 109/58 132/80 123/75  Pulse: 72 62 62 74  Resp: 18 17 16 17   Temp:  98.8 F (37.1 C) 98 F (36.7 C) 98.2 F  (36.8 C)  TempSrc:  Oral    SpO2: 99% 99%     General: alert, cooperative, and no distress Lochia: appropriate Uterine Fundus: firm Incision: N/A DVT Evaluation: No evidence of DVT seen on physical exam. No cords or calf tenderness. Labs: Lab Results  Component Value Date   WBC 9.1 09/26/2023   HGB 11.3 (L) 09/26/2023   HCT 33.2 (L) 09/26/2023   MCV 99.7 09/26/2023   PLT 80 (L) 09/26/2023      Latest Ref Rng & Units 12/15/2018    1:25 AM  CMP  Glucose 70 - 99 mg/dL 147   BUN 6 - 20 mg/dL 15   Creatinine 8.29 - 1.00 mg/dL 5.62   Sodium 130 - 865 mmol/L 138   Potassium 3.5 - 5.1 mmol/L 3.6   Chloride 98 - 111 mmol/L 103   CO2 22 - 32 mmol/L 27   Calcium 8.9 - 10.3 mg/dL 9.2   Total Protein 6.5 - 8.1 g/dL 6.6   Total Bilirubin 0.3 - 1.2 mg/dL 0.5   Alkaline Phos 38 - 126 U/L 35   AST 15 - 41 U/L 15   ALT 0 - 44 U/L 13    Edinburgh Score:    09/25/2023    4:21 AM  Edinburgh Postnatal Depression Scale Screening Tool  I have been able to laugh and see the funny side of things. 0  I have looked forward with enjoyment to things. 0  I have blamed myself unnecessarily when things went wrong. 1  I have been anxious or worried for  no good reason. 2  I have felt scared or panicky for no good reason. 2  Things have been getting on top of me. 1  I have been so unhappy that I have had difficulty sleeping. 0  I have felt sad or miserable. 1  I have been so unhappy that I have been crying. 1  The thought of harming myself has occurred to me. 0  Edinburgh Postnatal Depression Scale Total 8      After visit meds:  Allergies as of 09/26/2023       Reactions   Amoxicillin Rash   No swelling or SOB per pt report   Ampicillin Rash   No swelling or SOB per pt report   Erythromycin Rash   Penicillins Rash   Has patient had a PCN reaction causing immediate rash, facial/tongue/throat swelling, SOB or lightheadedness with hypotension: no Has patient had a PCN reaction causing  severe rash involving mucus membranes or skin necrosis: no Has patient had a PCN reaction that required hospitalization no Has patient had a PCN reaction occurring within the last 10 years: no If all of the above answers are "NO", then may proceed with Cephalosporin use.        Medication List     STOP taking these medications    nitrofurantoin (macrocrystal-monohydrate) 100 MG capsule Commonly known as: MACROBID   valACYclovir 500 MG tablet Commonly known as: VALTREX       TAKE these medications    acetaminophen 325 MG tablet Commonly known as: Tylenol Take 2 tablets (650 mg total) by mouth every 4 (four) hours as needed (for pain scale < 4). What changed: reasons to take this   albuterol 108 (90 Base) MCG/ACT inhaler Commonly known as: VENTOLIN HFA Inhale 2 puffs into the lungs every 4 (four) hours as needed for wheezing or shortness of breath.   hydrocortisone-pramoxine rectal foam Commonly known as: PROCTOFOAM-HC Place 1 applicator rectally in the morning and at bedtime.   omeprazole 40 MG capsule Commonly known as: PRILOSEC Take 40 mg by mouth every morning.   PRE-NATAL FORMULA PO Take 1 tablet by mouth daily.   PROBIOTIC PO Take 1 tablet by mouth daily.         Discharge home in stable condition Infant Feeding: Bottle and Breast Infant Disposition:home with mother Discharge instruction: per After Visit Summary and Postpartum booklet. Activity: Advance as tolerated. Pelvic rest for 6 weeks.  Diet: routine diet Anticipated Birth Control:  Vasectomy vs interval bilateral salpingectomy Postpartum Appointment:6 weeks Additional Postpartum F/U:  Lab visit next week to re-evaluate platelets  2 week EPDS telephone screen Future Appointments:No future appointments. Follow up Visit:  Follow-up Information     Steva Ready, DO Follow up in 1 week(s).   Specialty: Obstetrics and Gynecology Why: Our office will call to arrange a 1 week lab visit, 2 week  mood assessment/check, and your 6 week postpartum visit. Contact information: [address] [place] [address] [phone]                     09/26/2023 Steva Ready, DO

## 2023-09-26 NOTE — Progress Notes (Signed)
Postpartum Note Day #2  S:  Patient doing well.  Pain controlled.  Tolerating regular diet. Has not voided yet this AM. Ambulating without difficulty.  Reports cramping with pumping. Has been supplementing with formula. Reports hemorrhoidal pain. Asks about risks/benefits of laparoscopic bilateral salpingectomy vs vasectomy. Husband is now considering vasectomy. Denies fevers, chills, chest pain, SOB, N/V, or worsening bilateral LE edema.  Lochia: Minimal Infant feeding:  Pumping and formula Circumcision:  Desires prior to discharge Contraception:  Interval bilateral salpingectomy vs vasectomy  O: Temp:  [98 F (36.7 C)-98.8 F (37.1 C)] 98.2 F (36.8 C) (11/27 0516) Pulse Rate:  [62-74] 74 (11/27 0516) Resp:  [16-18] 17 (11/27 0516) BP: (109-132)/(58-80) 123/75 (11/27 0516) SpO2:  [99 %] 99 % (11/26 2208) Gen: NAD, pleasant and cooperative CV: Regular rate Resp: Normal work of breathing Abdomen: soft, non-distended, non-tender throughout Uterus: firm, non-tender, below umbilicus Ext: No bilateral LE edema, no bilateral calf tenderness  Labs:     Latest Ref Rng & Units 09/26/2023    4:26 AM 09/24/2023    9:30 PM 02/15/2023   12:03 PM  CBC  WBC 4.0 - 10.5 K/uL 9.1  9.0  7.4   Hemoglobin 12.0 - 15.0 g/dL 40.9  81.1  91.4   Hematocrit 36.0 - 46.0 % 33.2  37.6  40.1   Platelets 150 - 400 K/uL 80  109  185     A/P: Patient is a 37 y.o. [id number] PPD#2 s/p VBAC.  S/p VBAC - Pain well controlled  - GU: UOP is adequate - GI: Tolerating regular diet - Activity: encouraged sitting up to chair and ambulation as tolerated - DVT Prophylaxis: Ambulation, SCDs in bed - Labs: as above  Gestational Thrombocytopenia - Platelets 109 on admission to 80 this AM - Recommend repeat platelet count in 1 week in the office - Bleeding is appropriate  - Motrin discontinued - patient comfortable with Tylenol PRN  Anxiety/depression/PTSD - No medications, mood stable - Will need 2 week  EPDS  Desires permanent sterilization - Patient may consider interval bilateral salpingectomy vs vasectomy after discussion of risks/benefits  Disposition:  D/C home today.  Steva Ready, DO

## 2023-09-26 NOTE — Progress Notes (Signed)
MOB was referred for history of depression/anxiety.  * Referral screened out by Clinical Social Worker because none of the following criteria appear to apply:  ~ History of anxiety/depression during this pregnancy, or of post-partum depression following prior delivery.  ~ Diagnosis of anxiety and/or depression within last 3 years  Per OB notes, MOB did not indicate any signs/symptoms during her pregnancy  OR  * MOB's symptoms currently being treated with medication and/or therapy.   Please contact the Clinical Social Worker if needs arise, by [facility] request, or if MOB scores greater than 9/yes to question 10 on Edinburgh Postpartum Depression Screen.  Enos Fling, Theresia Majors Clinical Social Worker [phone]

## 2023-10-01 ENCOUNTER — Encounter (HOSPITAL_COMMUNITY)
Admission: RE | Admit: 2023-10-01 | Discharge: 2023-10-01 | Disposition: A | Discharge status: Home / Self Care | Payer: No Typology Code available for payment source | Source: Ambulatory Visit | Attending: Obstetrics and Gynecology | Admitting: Obstetrics and Gynecology

## 2023-10-01 HISTORY — DX: Thrombocytopenia, unspecified: D69.6

## 2023-10-02 ENCOUNTER — Inpatient Hospital Stay (HOSPITAL_COMMUNITY): Admit: 2023-10-02 | Payer: No Typology Code available for payment source | Admitting: Obstetrics and Gynecology

## 2023-10-05 ENCOUNTER — Telehealth (HOSPITAL_COMMUNITY): Payer: Self-pay | Admitting: *Deleted

## 2023-10-05 NOTE — Telephone Encounter (Signed)
10/05/2023  Name: Aracelie L Friesen MRN: [id number] DOB: 23-Mar-1986  Reason for Call:  Transition of Care Hospital Discharge Call  Contact Status: Patient Contact Status: Message  Language assistant needed:          Follow-Up Questions:    Inocente Salles Postnatal Depression Scale:  In the Past 7 Days:    PHQ2-9 Depression Scale:     Discharge Follow-up:    Post-discharge interventions: NA  Salena Saner, RN 10/05/2023 14:53

## 2024-01-23 ENCOUNTER — Ambulatory Visit (HOSPITAL_COMMUNITY): Admit: 2024-01-23 | Payer: No Typology Code available for payment source | Admitting: Obstetrics and Gynecology

## 2024-01-23 SURGERY — SALPINGECTOMY, BILATERAL, LAPAROSCOPIC
Anesthesia: Choice | Laterality: Bilateral
